# Patient Record
Sex: Male | Born: 1940 | Race: White | Hispanic: No | Smoking: Never smoker
Health system: Southern US, Community
[De-identification: ages and names within clinical notes are randomized; demographics above are authoritative.]

---

## 2001-05-12 ENCOUNTER — Ambulatory Visit (HOSPITAL_COMMUNITY): Admission: RE | Admit: 2001-05-12 | Discharge: 2001-05-12 | Payer: Self-pay | Admitting: Family Medicine

## 2017-11-23 ENCOUNTER — Inpatient Hospital Stay (HOSPITAL_COMMUNITY): Payer: Medicare Other

## 2017-11-23 ENCOUNTER — Emergency Department (HOSPITAL_COMMUNITY): Payer: Medicare Other

## 2017-11-23 ENCOUNTER — Inpatient Hospital Stay (HOSPITAL_COMMUNITY)
Admission: EM | Admit: 2017-11-23 | Discharge: 2017-12-04 | DRG: 064 | Disposition: E | Payer: Medicare Other | Attending: Pulmonary Disease | Admitting: Pulmonary Disease

## 2017-11-23 ENCOUNTER — Encounter (HOSPITAL_COMMUNITY): Payer: Self-pay

## 2017-11-23 ENCOUNTER — Other Ambulatory Visit: Payer: Self-pay

## 2017-11-23 DIAGNOSIS — J9600 Acute respiratory failure, unspecified whether with hypoxia or hypercapnia: Secondary | ICD-10-CM

## 2017-11-23 DIAGNOSIS — R739 Hyperglycemia, unspecified: Secondary | ICD-10-CM | POA: Diagnosis present

## 2017-11-23 DIAGNOSIS — G8101 Flaccid hemiplegia affecting right dominant side: Secondary | ICD-10-CM | POA: Diagnosis present

## 2017-11-23 DIAGNOSIS — I2609 Other pulmonary embolism with acute cor pulmonale: Secondary | ICD-10-CM | POA: Diagnosis present

## 2017-11-23 DIAGNOSIS — I110 Hypertensive heart disease with heart failure: Secondary | ICD-10-CM | POA: Diagnosis present

## 2017-11-23 DIAGNOSIS — T45515A Adverse effect of anticoagulants, initial encounter: Secondary | ICD-10-CM | POA: Diagnosis not present

## 2017-11-23 DIAGNOSIS — I619 Nontraumatic intracerebral hemorrhage, unspecified: Secondary | ICD-10-CM | POA: Diagnosis present

## 2017-11-23 DIAGNOSIS — Z515 Encounter for palliative care: Secondary | ICD-10-CM

## 2017-11-23 DIAGNOSIS — I639 Cerebral infarction, unspecified: Secondary | ICD-10-CM

## 2017-11-23 DIAGNOSIS — I2699 Other pulmonary embolism without acute cor pulmonale: Secondary | ICD-10-CM

## 2017-11-23 DIAGNOSIS — R29724 NIHSS score 24: Secondary | ICD-10-CM | POA: Diagnosis present

## 2017-11-23 DIAGNOSIS — I63312 Cerebral infarction due to thrombosis of left middle cerebral artery: Secondary | ICD-10-CM | POA: Diagnosis present

## 2017-11-23 DIAGNOSIS — W19XXXA Unspecified fall, initial encounter: Secondary | ICD-10-CM

## 2017-11-23 DIAGNOSIS — I361 Nonrheumatic tricuspid (valve) insufficiency: Secondary | ICD-10-CM | POA: Diagnosis not present

## 2017-11-23 DIAGNOSIS — Z4659 Encounter for fitting and adjustment of other gastrointestinal appliance and device: Secondary | ICD-10-CM

## 2017-11-23 DIAGNOSIS — G935 Compression of brain: Secondary | ICD-10-CM | POA: Diagnosis not present

## 2017-11-23 DIAGNOSIS — G9340 Encephalopathy, unspecified: Secondary | ICD-10-CM | POA: Diagnosis present

## 2017-11-23 DIAGNOSIS — R29810 Facial weakness: Secondary | ICD-10-CM | POA: Diagnosis present

## 2017-11-23 DIAGNOSIS — J9601 Acute respiratory failure with hypoxia: Secondary | ICD-10-CM | POA: Diagnosis present

## 2017-11-23 DIAGNOSIS — G936 Cerebral edema: Secondary | ICD-10-CM | POA: Diagnosis not present

## 2017-11-23 DIAGNOSIS — Z66 Do not resuscitate: Secondary | ICD-10-CM | POA: Diagnosis present

## 2017-11-23 DIAGNOSIS — I9589 Other hypotension: Secondary | ICD-10-CM | POA: Diagnosis present

## 2017-11-23 DIAGNOSIS — E785 Hyperlipidemia, unspecified: Secondary | ICD-10-CM | POA: Diagnosis present

## 2017-11-23 DIAGNOSIS — I63412 Cerebral infarction due to embolism of left middle cerebral artery: Secondary | ICD-10-CM

## 2017-11-23 DIAGNOSIS — D696 Thrombocytopenia, unspecified: Secondary | ICD-10-CM | POA: Diagnosis present

## 2017-11-23 DIAGNOSIS — R4701 Aphasia: Secondary | ICD-10-CM | POA: Diagnosis present

## 2017-11-23 DIAGNOSIS — Z87891 Personal history of nicotine dependence: Secondary | ICD-10-CM | POA: Diagnosis not present

## 2017-11-23 DIAGNOSIS — I5032 Chronic diastolic (congestive) heart failure: Secondary | ICD-10-CM | POA: Diagnosis present

## 2017-11-23 DIAGNOSIS — E872 Acidosis: Secondary | ICD-10-CM | POA: Diagnosis present

## 2017-11-23 DIAGNOSIS — Z8673 Personal history of transient ischemic attack (TIA), and cerebral infarction without residual deficits: Secondary | ICD-10-CM | POA: Diagnosis not present

## 2017-11-23 LAB — HEMOGLOBIN A1C
Hgb A1c MFr Bld: 5.5 % (ref 4.8–5.6)
Mean Plasma Glucose: 111.15 mg/dL

## 2017-11-23 LAB — CBC WITH DIFFERENTIAL/PLATELET
Basophils Absolute: 0 10*3/uL (ref 0.0–0.1)
Basophils Relative: 0 %
EOS ABS: 0.1 10*3/uL (ref 0.0–0.7)
EOS PCT: 1 %
HCT: 45.5 % (ref 39.0–52.0)
Hemoglobin: 15.4 g/dL (ref 13.0–17.0)
LYMPHS ABS: 3.5 10*3/uL (ref 0.7–4.0)
Lymphocytes Relative: 40 %
MCH: 33.3 pg (ref 26.0–34.0)
MCHC: 33.8 g/dL (ref 30.0–36.0)
MCV: 98.3 fL (ref 78.0–100.0)
MONO ABS: 0.5 10*3/uL (ref 0.1–1.0)
MONOS PCT: 6 %
Neutro Abs: 4.7 10*3/uL (ref 1.7–7.7)
Neutrophils Relative %: 53 %
PLATELETS: 127 10*3/uL — AB (ref 150–400)
RBC: 4.63 MIL/uL (ref 4.22–5.81)
RDW: 13 % (ref 11.5–15.5)
WBC: 8.8 10*3/uL (ref 4.0–10.5)

## 2017-11-23 LAB — URINALYSIS, ROUTINE W REFLEX MICROSCOPIC
BACTERIA UA: NONE SEEN
BILIRUBIN URINE: NEGATIVE
Glucose, UA: 150 mg/dL — AB
KETONES UR: NEGATIVE mg/dL
LEUKOCYTES UA: NEGATIVE
NITRITE: NEGATIVE
PH: 5 (ref 5.0–8.0)
Protein, ur: NEGATIVE mg/dL
Specific Gravity, Urine: 1.036 — ABNORMAL HIGH (ref 1.005–1.030)
Squamous Epithelial / LPF: NONE SEEN

## 2017-11-23 LAB — PROTIME-INR
INR: 1.21
INR: 1.35
Prothrombin Time: 15.2 seconds (ref 11.4–15.2)
Prothrombin Time: 16.6 seconds — ABNORMAL HIGH (ref 11.4–15.2)

## 2017-11-23 LAB — COMPREHENSIVE METABOLIC PANEL
ALBUMIN: 3.9 g/dL (ref 3.5–5.0)
ALT: 17 U/L (ref 17–63)
AST: 27 U/L (ref 15–41)
Alkaline Phosphatase: 76 U/L (ref 38–126)
Anion gap: 13 (ref 5–15)
BUN: 11 mg/dL (ref 6–20)
CHLORIDE: 102 mmol/L (ref 101–111)
CO2: 19 mmol/L — AB (ref 22–32)
CREATININE: 1.43 mg/dL — AB (ref 0.61–1.24)
Calcium: 8.8 mg/dL — ABNORMAL LOW (ref 8.9–10.3)
GFR calc Af Amer: 54 mL/min — ABNORMAL LOW (ref >60)
GFR calc non Af Amer: 46 mL/min — ABNORMAL LOW (ref >60)
GLUCOSE: 218 mg/dL — AB (ref 65–99)
Potassium: 4.1 mmol/L (ref 3.5–5.1)
Sodium: 134 mmol/L — ABNORMAL LOW (ref 135–145)
Total Bilirubin: 0.8 mg/dL (ref 0.3–1.2)
Total Protein: 7.2 g/dL (ref 6.5–8.1)

## 2017-11-23 LAB — I-STAT ARTERIAL BLOOD GAS, ED
Acid-base deficit: 5 mmol/L — ABNORMAL HIGH (ref 0.0–2.0)
BICARBONATE: 21.8 mmol/L (ref 20.0–28.0)
O2 SAT: 99 %
PCO2 ART: 47.7 mmHg (ref 32.0–48.0)
PO2 ART: 156 mmHg — AB (ref 83.0–108.0)
Patient temperature: 98.6
TCO2: 23 mmol/L (ref 22–32)
pH, Arterial: 7.268 — ABNORMAL LOW (ref 7.350–7.450)

## 2017-11-23 LAB — I-STAT CHEM 8, ED
BUN: 13 mg/dL (ref 6–20)
CALCIUM ION: 1.18 mmol/L (ref 1.15–1.40)
Chloride: 100 mmol/L — ABNORMAL LOW (ref 101–111)
Creatinine, Ser: 1.2 mg/dL (ref 0.61–1.24)
GLUCOSE: 221 mg/dL — AB (ref 65–99)
HCT: 47 % (ref 39.0–52.0)
Hemoglobin: 16 g/dL (ref 13.0–17.0)
Potassium: 4.1 mmol/L (ref 3.5–5.1)
SODIUM: 138 mmol/L (ref 135–145)
TCO2: 22 mmol/L (ref 22–32)

## 2017-11-23 LAB — I-STAT TROPONIN, ED: Troponin i, poc: 0 ng/mL (ref 0.00–0.08)

## 2017-11-23 LAB — ETHANOL

## 2017-11-23 LAB — GLUCOSE, CAPILLARY
GLUCOSE-CAPILLARY: 110 mg/dL — AB (ref 65–99)
GLUCOSE-CAPILLARY: 90 mg/dL (ref 65–99)
Glucose-Capillary: 79 mg/dL (ref 65–99)

## 2017-11-23 LAB — CK TOTAL AND CKMB (NOT AT ARMC)
CK, MB: 9.2 ng/mL — AB (ref 0.5–5.0)
RELATIVE INDEX: 3.6 — AB (ref 0.0–2.5)
Total CK: 255 U/L (ref 49–397)

## 2017-11-23 LAB — RAPID URINE DRUG SCREEN, HOSP PERFORMED
Amphetamines: NOT DETECTED
Barbiturates: NOT DETECTED
Benzodiazepines: NOT DETECTED
Cocaine: NOT DETECTED
OPIATES: NOT DETECTED
Tetrahydrocannabinol: NOT DETECTED

## 2017-11-23 LAB — MAGNESIUM: Magnesium: 1.8 mg/dL (ref 1.7–2.4)

## 2017-11-23 LAB — LACTIC ACID, PLASMA: Lactic Acid, Venous: 2.2 mmol/L (ref 0.5–1.9)

## 2017-11-23 LAB — TROPONIN I
TROPONIN I: 0.41 ng/mL — AB (ref <0.03)
TROPONIN I: 0.29 ng/mL — AB (ref <0.03)

## 2017-11-23 LAB — CBG MONITORING, ED: GLUCOSE-CAPILLARY: 109 mg/dL — AB (ref 65–99)

## 2017-11-23 LAB — TRIGLYCERIDES: TRIGLYCERIDES: 84 mg/dL (ref <150)

## 2017-11-23 LAB — APTT
APTT: 28 s (ref 24–36)
aPTT: 184 seconds (ref 24–36)

## 2017-11-23 LAB — MRSA PCR SCREENING: MRSA BY PCR: NEGATIVE

## 2017-11-23 MED ORDER — INSULIN ASPART 100 UNIT/ML ~~LOC~~ SOLN
0.0000 [IU] | SUBCUTANEOUS | Status: DC
  Administered 2017-11-24 (×2): 1 [IU] via SUBCUTANEOUS

## 2017-11-23 MED ORDER — PROPOFOL 1000 MG/100ML IV EMUL
INTRAVENOUS | Status: AC
  Administered 2017-11-23: 30 ug/kg/min via INTRAVENOUS
  Filled 2017-11-23: qty 100

## 2017-11-23 MED ORDER — ETOMIDATE 2 MG/ML IV SOLN
INTRAVENOUS | Status: AC | PRN
  Administered 2017-11-23: 20 mg via INTRAVENOUS

## 2017-11-23 MED ORDER — ACETAMINOPHEN 650 MG RE SUPP: 650.0000 mg | RECTAL | Status: DC | PRN

## 2017-11-23 MED ORDER — SODIUM CHLORIDE 0.9 % IV SOLN
INTRAVENOUS | Status: DC
  Administered 2017-11-23 – 2017-11-25 (×4): via INTRAVENOUS

## 2017-11-23 MED ORDER — FENTANYL CITRATE (PF) 100 MCG/2ML IJ SOLN: 50.0000 ug | INTRAMUSCULAR | Status: DC | PRN

## 2017-11-23 MED ORDER — LORAZEPAM 2 MG/ML IJ SOLN
INTRAMUSCULAR | Status: AC
  Administered 2017-11-23: 1 mg
  Filled 2017-11-23: qty 1

## 2017-11-23 MED ORDER — HEPARIN BOLUS VIA INFUSION
5000.0000 [IU] | Freq: Once | INTRAVENOUS | Status: AC
  Administered 2017-11-23: 5000 [IU] via INTRAVENOUS
  Filled 2017-11-23: qty 5000

## 2017-11-23 MED ORDER — ACETAMINOPHEN 160 MG/5ML PO SOLN
650.0000 mg | ORAL | Status: DC | PRN
  Administered 2017-11-23 – 2017-11-25 (×3): 650 mg
  Filled 2017-11-23 (×4): qty 20.3

## 2017-11-23 MED ORDER — PROPOFOL 1000 MG/100ML IV EMUL
0.0000 ug/kg/min | INTRAVENOUS | Status: DC
  Administered 2017-11-23 (×2): 30 ug/kg/min via INTRAVENOUS
  Administered 2017-11-23: 10 ug/kg/min via INTRAVENOUS
  Administered 2017-11-24 – 2017-11-25 (×3): 20 ug/kg/min via INTRAVENOUS
  Filled 2017-11-23 (×5): qty 100

## 2017-11-23 MED ORDER — STROKE: EARLY STAGES OF RECOVERY BOOK
Freq: Once | Status: AC
  Administered 2017-11-23: 18:00:00
  Filled 2017-11-23: qty 1

## 2017-11-23 MED ORDER — HEPARIN (PORCINE) IN NACL 100-0.45 UNIT/ML-% IJ SOLN
1200.0000 [IU]/h | INTRAMUSCULAR | Status: DC
  Administered 2017-11-23 – 2017-11-24 (×2): 1350 [IU]/h via INTRAVENOUS
  Filled 2017-11-23 (×4): qty 250

## 2017-11-23 MED ORDER — LORAZEPAM 2 MG/ML IJ SOLN
1.0000 mg | Freq: Once | INTRAMUSCULAR | Status: DC
  Administered 2017-11-23: 1 mg via INTRAVENOUS

## 2017-11-23 MED ORDER — PANTOPRAZOLE SODIUM 40 MG IV SOLR
40.0000 mg | Freq: Every day | INTRAVENOUS | Status: DC
  Administered 2017-11-23 – 2017-11-24 (×2): 40 mg via INTRAVENOUS
  Filled 2017-11-23 (×2): qty 40

## 2017-11-23 MED ORDER — FENTANYL CITRATE (PF) 100 MCG/2ML IJ SOLN
50.0000 ug | INTRAMUSCULAR | Status: AC | PRN
  Administered 2017-11-25 (×3): 50 ug via INTRAVENOUS
  Filled 2017-11-23 (×3): qty 2

## 2017-11-23 MED ORDER — ORAL CARE MOUTH RINSE
15.0000 mL | Freq: Four times a day (QID) | OROMUCOSAL | Status: DC
  Administered 2017-11-23: 15 mL via OROMUCOSAL

## 2017-11-23 MED ORDER — IOPAMIDOL (ISOVUE-370) INJECTION 76%
INTRAVENOUS | Status: AC
  Administered 2017-11-23: 40 mL
  Filled 2017-11-23: qty 50

## 2017-11-23 MED ORDER — CHLORHEXIDINE GLUCONATE 0.12% ORAL RINSE (MEDLINE KIT)
15.0000 mL | Freq: Two times a day (BID) | OROMUCOSAL | Status: DC
  Administered 2017-11-23 – 2017-11-25 (×4): 15 mL via OROMUCOSAL

## 2017-11-23 MED ORDER — ACETAMINOPHEN 325 MG PO TABS: 650.0000 mg | ORAL_TABLET | ORAL | Status: DC | PRN

## 2017-11-23 MED ORDER — ROCURONIUM BROMIDE 50 MG/5ML IV SOLN
INTRAVENOUS | Status: AC | PRN
  Administered 2017-11-23: 80 mg via INTRAVENOUS

## 2017-11-23 MED ORDER — ORAL CARE MOUTH RINSE
15.0000 mL | OROMUCOSAL | Status: DC
  Administered 2017-11-23 – 2017-11-25 (×15): 15 mL via OROMUCOSAL

## 2017-11-23 NOTE — ED Notes (Signed)
 1mg  ativan given, 5 total mg now.

## 2017-11-23 NOTE — ED Notes (Addendum)
 1mg  ativan given while pt in CT. 4 total mg of ativan given now.

## 2017-11-23 NOTE — ED Notes (Signed)
[facility] called @ 1140/Activate Code Stroke-per Dr. Rubin PayorPickering

## 2017-11-23 NOTE — Progress Notes (Signed)
Dr Molli KnockYacoub notified of PTT level of 184, pharmacist Aggie Cosierheresa notified and she stated it was due to blood draw being too close to heparin bolus and they would reassess at midnight draw. Dr Molli KnockYacoub aware of pharmacist update and troponin level of 0.4. No new orders given will continue to monitor.

## 2017-11-23 NOTE — ED Notes (Signed)
All pt's belongings sent home with pt's sister and brother in law. This includes the pt's pant,s undewear, boots, socks and license.

## 2017-11-23 NOTE — ED Notes (Addendum)
Pt's sister now here, Tilden FossaSandra Winstead [phone]. She states that the [place] jog and has had no medical issues except for an eye surgery. Pt does not go to doctors and does not like to and has not been to the dr in a long time. She states that she has not talked to him since Thanksgiving.

## 2017-11-23 NOTE — Progress Notes (Signed)
ANTICOAGULATION CONSULT NOTE - Initial Consult  Pharmacy Consult for Heparin Indication: pulmonary embolus  No Known Allergies  Patient Measurements: Height: 6' (182.9 cm) Weight: 176 lb 5.9 oz (80 kg) IBW/kg (Calculated) : 77.6 Heparin Dosing Weight: 80 kg   Vital Signs: Temp: 98.5 F (36.9 C) (12/21 1305) Temp Source: Rectal (12/21 1305) BP: 118/90 (12/21 1430) Pulse Rate: 97 (12/21 1430)  Labs: Recent Labs    12/02/2017 1135 11/03/2017 1148 11/15/2017 1153  HGB 15.4  --  16.0  HCT 45.5  --  47.0  PLT 127*  --   --   APTT  --  28  --   LABPROT  --  15.2  --   INR  --  1.21  --   CREATININE 1.43*  --  1.20    Estimated Creatinine Clearance: 58.4 mL/min (by C-G formula based on SCr of 1.2 mg/dL).   Medical History: History reviewed. No pertinent past medical history.  Medications:   (Not in a hospital admission)  Assessment: [age] YOM found down brought to the ED and intubated. Pharmacy consulted to start IV heparin for new PE. H/H wnl. Plt 127k. SCr 1.2.   Goal of Therapy:  Heparin level 0.3-0.7 units/ml Monitor platelets by anticoagulation protocol: Yes   Plan:  -Heparin 5000 units IV bolus, then heparin infusion at 1350 units/hr -F/u 8 hr HL -Monitor daily HL, CBC and s/s of bleeding  Zachary Greene, PharmD., BCPS Clinical Pharmacist Pager [phone]

## 2017-11-23 NOTE — Progress Notes (Signed)
RN alerted neurology MD on call as to patient's fever and interventions given.  Ordered to observe.

## 2017-11-23 NOTE — ED Notes (Signed)
Pt given 1mg  of ativan

## 2017-11-23 NOTE — ED Notes (Signed)
Pt taken back to CT by this RN and 2 stroke RN's for CTA.

## 2017-11-23 NOTE — Code Documentation (Signed)
76[facility]  [facility]  Unknown LKW.  Patient brought to the ED where he was noted to have left gaze and right hemiplegia on exam.  Code stroke activated.  Stroke team to the bedside.  Patient agitated, Ativan administered per MD.  Patient to CT with team.  CT completed followed by CTP.  Additional Ativan administered per MD. Patient transferred back to Trauma B for intubation.  Patient intubated by EDP.  Patient transferred back to CT for CTA.  Patient is outside the window for treatment with tPA.  Patient is not an endovascular intervention candidate per Dr. Otelia LimesLindzen.  Patient transferred back to Trauma B.  Patient's sister to the bedside.  She reports patient walks/jogs every morning.  She has not seen or talked to patient since Thanksgiving.  No acute stroke treatment at this time.  Patient to be admitted to ICU.  Bedside handoff with ED RN Caryn BeeKevin.

## 2017-11-23 NOTE — ED Notes (Signed)
 1mg  ativan given, 3mg  total given now

## 2017-11-23 NOTE — ED Notes (Addendum)
Per EMS, pt found down at [place] unresponsive and blue. Unknown downtime. Pt placed on NRB. Pt noted to have right side paralysis and right leg parylsis with left sided gaze. Hypertensive. Not following any commands and pulling with his left arm and leg. Cold to the touch. VS 154/105, CBG 230, HR 115, spo2 unreadable due to pt being cold

## 2017-11-23 NOTE — H&P (Signed)
PULMONARY / CRITICAL CARE MEDICINE   Name: Zachary Greene MRN: [id number] DOB: 02/27/1941    ADMISSION DATE:  December 18, 2017 CONSULTATION DATE:  12/21  REFERRING MD:  pickering  CHIEF COMPLAINT:  Acute stroke w/ acute pulmonary emboli  HISTORY OF PRESENT ILLNESS:   This is a 76 year old male who lives independently. Last seen by family around thanksgiving. Apparently works, runs in park every day. Was found down in park unresponsive on walking trail w/ right sided weakness. Down time unknown. Was brought to ER. Found to be hypoxic and agitated so was intubated and placed on vent. CT head showing massive acute left MCA stroke, and incidentally bottom cuts of CT imaging demonstrated acute pulmonary emboli.  The core size of the stroke was felt to large for neuro interventional procedure.  Critical care was asked to admit.  PAST MEDICAL HISTORY :  He  has no past medical history on file.  PAST SURGICAL HISTORY: He  has no past surgical history on file.  No Known Allergies  No current facility-administered medications on file prior to encounter.    No current outpatient medications on file prior to encounter.    FAMILY HISTORY:  His has no family status information on file.    SOCIAL HISTORY: He  reports that  has never smoked. He does not have any smokeless tobacco history on file. He reports that he does not drink alcohol or use drugs.Stopped smoking several years ago lives alone runs daily apparently still works  REVIEW OF SYSTEMS:   Unable  SUBJECTIVE:  Sedated on vent  VITAL SIGNS: BP 117/90   Pulse 88   Temp 98.5 F (36.9 C) (Rectal)   Resp (!) 21   Ht 6' (1.829 m)   Wt 176 lb 5.9 oz (80 kg)   SpO2 93%   BMI 23.92 kg/m   HEMODYNAMICS:    VENTILATOR SETTINGS: FiO2 (%):  [60 %] 60 %  INTAKE / OUTPUT: No intake/output data recorded.  PHYSICAL EXAMINATION: General: Well-developed white male currently sedated on ventilator occasionally agitated Neuro:  Right-sided hemiparesis cough and gag present does not follow commands  HEENT:  normocephalic atraumatic orally intubated no jugular venous distention mucous membranes moist Cardiovascular: Regular rate and rhythm without murmur rub or gallop Lungs: Scattered rhonchi Abdomen: Soft nontender Musculoskeletal: Right-sided weakness Skin: Warm and dry  LABS:  BMET Recent Labs  Lab 12-18-17 1135 2017/12/18 1153  NA 134* 138  K 4.1 4.1  CL 102 100*  CO2 19*  --   BUN 11 13  CREATININE 1.43* 1.20  GLUCOSE 218* 221*    Electrolytes Recent Labs  Lab 12-18-2017 1135  CALCIUM 8.8*    CBC Recent Labs  Lab 12/18/2017 1135 Dec 18, 2017 1153  WBC 8.8  --   HGB 15.4 16.0  HCT 45.5 47.0  PLT 127*  --     Coag's Recent Labs  Lab 12-18-2017 1148  APTT 28  INR 1.21    Sepsis Markers No results for input(s): LATICACIDVEN, PROCALCITON, O2SATVEN in the last 168 hours.  ABG Recent Labs  Lab 2017-12-18 1310  PHART 7.268*  PCO2ART 47.7  PO2ART 156.0*    Liver Enzymes Recent Labs  Lab Dec 18, 2017 1135  AST 27  ALT 17  ALKPHOS 76  BILITOT 0.8  ALBUMIN 3.9    Cardiac Enzymes No results for input(s): TROPONINI, PROBNP in the last 168 hours.  Glucose Recent Labs  Lab 2017/12/18 1244  GLUCAP 109*    Imaging Ct Angio Head W  Or Wo Contrast  Result Date: 11/06/2017 CLINICAL DATA:  76 year old male. Code stroke. CT perfusion demonstrating left MCA territory core infarct and penumbra. EXAM: CT ANGIOGRAPHY HEAD AND NECK TECHNIQUE: Multidetector CT imaging of the head and neck was performed using the standard protocol during bolus administration of intravenous contrast. Multiplanar CT image reconstructions and MIPs were obtained to evaluate the vascular anatomy. Carotid stenosis measurements (when applicable) are obtained utilizing NASCET criteria, using the distal internal carotid diameter as the denominator. CONTRAST:  50 mL Isovue 370 COMPARISON:  CT perfusion 1211 hours today.  Noncontrasthead CT 1158 hours today. FINDINGS: CTA NECK Skeleton: Degenerative changes in the cervical spine. No acute osseous abnormality identified. Visualized paranasal sinuses and mastoids are stable and well pneumatized. Upper chest: Bilateral main pulmonary artery emboli, with bulky thrombus at both hila. See series 5, image 198 on the left and image 206 on the right. Pulmonary thrombus extends into anterior upper lobe pulmonary artery branches bilaterally. The remaining chest is not included. Mild upper lobe pulmonary septal thickening. No confluent pulmonary opacity or pleural effusion. No superior mediastinal lymphadenopathy. Enteric tube courses through the visible esophagus. Other neck: Oral enteric tube in place. Enteric tube courses into the cervical esophagus. Endotracheal tube tip terminates in the trachea at the level of the clavicles. Small volume retained secretions in the trachea are row on the tube. Thyroid, parapharyngeal spaces, retropharyngeal space, sublingual space, submandibular spaces and parotid spaces are within normal limits. Fluid in the pharynx and posterior nasal cavity related to intubation. No cervical lymphadenopathy. No acute orbit or scalp soft tissue findings. Aortic arch: Abnormal bilateral main pulmonary arteries, as stated above. Three vessel arch configuration. No arch atherosclerosis or stenosis. Right carotid system: No brachiocephalic or right CCA origin plaque or stenosis. At the right carotid bifurcation there is mild soft and calcified plaque but no significant ICA origins stenosis. However, bulky soft plaque been tracks throughout the right ICA bulb with high-grade stenosis at the level of the distal bulb numerically estimated at 70 % with respect to the distal vessel. See series 8, image 72. The right ICA remains patent, and is tortuous distal to the bulb but without additional plaque. Left carotid system: Normal left CCA origin. No left CCA plaque identified.  Widely patent left carotid bifurcation. Somewhat unusual bulb type appearance of the proximal left ECA which is directed laterally (also series 8, image 73). Soft and calcified plaque in the medial right ICA origin and bulb, but no associated stenosis. Cervical left ICA then is within normal limits to the skullbase. Vertebral arteries: No proximal right subclavian artery stenosis despite calcified plaque. Normal right vertebral artery origin. Mildly non dominant right vertebral artery is patent to the skullbase without stenosis. Mild soft plaque in the proximal left subclavian artery without stenosis. Calcified plaque at the left vertebral artery origin and proximal V1 segment resulting in mild stenosis. Dominant left vertebral artery is otherwise patent and normal to the skullbase. CTA HEAD Posterior circulation: Dominant distal left vertebral artery. No distal vertebral stenosis. Both PICA origins are patent. Patent vertebrobasilar junction. Somewhat diminutive basilar artery, but no basilar stenosis. Patent SCA origins. Fetal type bilateral PCA origins. Posterior communicating arteries appear normal. Bilateral PCA branches are within normal limits. Anterior circulation: Both ICA siphons are patent. There is mild bilateral calcified siphon plaque. No bilateral siphon stenosis. Normal ophthalmic and posterior communicating artery origins. Patent carotid termini. Normal MCA and ACA origins, the left A1 is dominant and the right is highly diminutive. Anterior  communicating artery and bilateral ACA branches are normal. Right MCA M1 segment, bifurcation, and right MCA branches are within normal limits. The left MCA origin and proximal M1 segment is patent but there is partial occlusion of the distal left MCA M1 and left MCA bifurcation. See series 10, image 19. There is little left MCA branch collateral enhancement in the early phase, mostly in the middle left MCA division. Venous sinuses: Early contrast bolus timing,  the major dural venous sinuses appear grossly patent. Anatomic variants: Dominant left vertebral artery. Dominant left ACA A1 segment. Fetal type PCA origins. Review of the MIP images confirms the above findings IMPRESSION: 1. Positive for bilateral Acute Pulmonary Embolus. Partially visible but fairly bulky thrombus in the distal main pulmonary arteries at both hila. Critical Value/emergent results were called by telephone at the time of interpretation on 16-Dec-2017 at 1335 hours to Dr. Carmell Austria in the ED who verbally acknowledged these results. 2. Positive for Emergent Large Vessel Occlusion, distal Left MCA M1 segment. However, non-favorable appearance of collateral enhancement in the left MCA branches, and an estimated 78 mL core infarct on CT perfusion (at 1211 hours today). 3. 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 4. No other significant atherosclerotic stenosis in the head or neck. 5. ET tube and NG tube appear well placed. Electronically Signed   By: Odessa Fleming M.D.   On: 12-16-17 13:51   Ct Angio Neck W Or Wo Contrast  Result Date: December 16, 2017 CLINICAL DATA:  76 year old male. Code stroke. CT perfusion demonstrating left MCA territory core infarct and penumbra. EXAM: CT ANGIOGRAPHY HEAD AND NECK TECHNIQUE: Multidetector CT imaging of the head and neck was performed using the standard protocol during bolus administration of intravenous contrast. Multiplanar CT image reconstructions and MIPs were obtained to evaluate the vascular anatomy. Carotid stenosis measurements (when applicable) are obtained utilizing NASCET criteria, using the distal internal carotid diameter as the denominator. CONTRAST:  50 mL Isovue 370 COMPARISON:  CT perfusion 1211 hours today. Noncontrasthead CT 1158 hours today. FINDINGS: CTA NECK Skeleton: Degenerative changes in the cervical spine. No acute osseous abnormality identified. Visualized  paranasal sinuses and mastoids are stable and well pneumatized. Upper chest: Bilateral main pulmonary artery emboli, with bulky thrombus at both hila. See series 5, image 198 on the left and image 206 on the right. Pulmonary thrombus extends into anterior upper lobe pulmonary artery branches bilaterally. The remaining chest is not included. Mild upper lobe pulmonary septal thickening. No confluent pulmonary opacity or pleural effusion. No superior mediastinal lymphadenopathy. Enteric tube courses through the visible esophagus. Other neck: Oral enteric tube in place. Enteric tube courses into the cervical esophagus. Endotracheal tube tip terminates in the trachea at the level of the clavicles. Small volume retained secretions in the trachea are row on the tube. Thyroid, parapharyngeal spaces, retropharyngeal space, sublingual space, submandibular spaces and parotid spaces are within normal limits. Fluid in the pharynx and posterior nasal cavity related to intubation. No cervical lymphadenopathy. No acute orbit or scalp soft tissue findings. Aortic arch: Abnormal bilateral main pulmonary arteries, as stated above. Three vessel arch configuration. No arch atherosclerosis or stenosis. Right carotid system: No brachiocephalic or right CCA origin plaque or stenosis. At the right carotid bifurcation there is mild soft and calcified plaque but no significant ICA origins stenosis. However, bulky soft plaque been tracks throughout the right ICA bulb with high-grade stenosis at the level of the distal bulb  numerically estimated at 70 % with respect to the distal vessel. See series 8, image 72. The right ICA remains patent, and is tortuous distal to the bulb but without additional plaque. Left carotid system: Normal left CCA origin. No left CCA plaque identified. Widely patent left carotid bifurcation. Somewhat unusual bulb type appearance of the proximal left ECA which is directed laterally (also series 8, image 73). Soft and  calcified plaque in the medial right ICA origin and bulb, but no associated stenosis. Cervical left ICA then is within normal limits to the skullbase. Vertebral arteries: No proximal right subclavian artery stenosis despite calcified plaque. Normal right vertebral artery origin. Mildly non dominant right vertebral artery is patent to the skullbase without stenosis. Mild soft plaque in the proximal left subclavian artery without stenosis. Calcified plaque at the left vertebral artery origin and proximal V1 segment resulting in mild stenosis. Dominant left vertebral artery is otherwise patent and normal to the skullbase. CTA HEAD Posterior circulation: Dominant distal left vertebral artery. No distal vertebral stenosis. Both PICA origins are patent. Patent vertebrobasilar junction. Somewhat diminutive basilar artery, but no basilar stenosis. Patent SCA origins. Fetal type bilateral PCA origins. Posterior communicating arteries appear normal. Bilateral PCA branches are within normal limits. Anterior circulation: Both ICA siphons are patent. There is mild bilateral calcified siphon plaque. No bilateral siphon stenosis. Normal ophthalmic and posterior communicating artery origins. Patent carotid termini. Normal MCA and ACA origins, the left A1 is dominant and the right is highly diminutive. Anterior communicating artery and bilateral ACA branches are normal. Right MCA M1 segment, bifurcation, and right MCA branches are within normal limits. The left MCA origin and proximal M1 segment is patent but there is partial occlusion of the distal left MCA M1 and left MCA bifurcation. See series 10, image 19. There is little left MCA branch collateral enhancement in the early phase, mostly in the middle left MCA division. Venous sinuses: Early contrast bolus timing, the major dural venous sinuses appear grossly patent. Anatomic variants: Dominant left vertebral artery. Dominant left ACA A1 segment. Fetal type PCA origins. Review  of the MIP images confirms the above findings IMPRESSION: 1. Positive for bilateral Acute Pulmonary Embolus. Partially visible but fairly bulky thrombus in the distal main pulmonary arteries at both hila. Critical Value/emergent results were called by telephone at the time of interpretation on 11/22/2017 at 1335 hours to Dr. Carmell Austria in the ED who verbally acknowledged these results. 2. Positive for Emergent Large Vessel Occlusion, distal Left MCA M1 segment. However, non-favorable appearance of collateral enhancement in the left MCA branches, and an estimated 78 mL core infarct on CT perfusion (at 1211 hours today). 3. 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 4. No other significant atherosclerotic stenosis in the head or neck. 5. ET tube and NG tube appear well placed. Electronically Signed   By: Odessa Fleming M.D.   On: 11/05/2017 13:51   Ct C-spine No Charge  Result Date: 11/29/2017 CLINICAL DATA:  76 year old male code stroke and fall. EXAM: CT CERVICAL SPINE WITHOUT CONTRAST TECHNIQUE: CT images of the cervical spine were reformatted off of the Neck CTA performed today. COMPARISON:  CTA head and neck today reported separately. FINDINGS: Alignment: Relatively preserved cervical lordosis. Mild degenerative appearing retrolisthesis of C4 on C5. Similar trace anterolisthesis of C7 on T1. Cervicothoracic junction alignment is within normal limits. Bilateral posterior element alignment is within normal limits. Skull base and vertebrae:  Visualized skull base is intact. No atlanto-occipital dissociation. No cervical spine fracture. Soft tissues and spinal canal: Negative CT appearance of the cervical spinal canal aside from degenerative stenosis. Other neck CT and CTA findings today are reported separately. Disc levels: Moderate and severe cervical disc space loss with endplate spurring U0-A5C3-C4 through C6-C7. Associated multilevel  degenerative spinal stenosis at those disc levels. Stenosis is up to moderate at C4-C5. There is associated bilateral degenerative neural foraminal stenosis at those same levels, severe bilaterally at C4-C5 (bilateral C5 nerve level). Upper chest: Negative lung apices. See other chest findings today on the comparison CTA. IMPRESSION: 1.  No acute fracture in the cervical spine. 2. 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 Up to moderate spinal stenosis at C4-C5. Electronically Signed   By: Odessa FlemingH  Hall M.D.   On: 11/19/2017 13:56   Ct Cerebral Perfusion W Contrast  Addendum Date: 11/16/2017   ADDENDUM REPORT: 11/18/2017 13:08 ADDENDUM: Study discussed by telephone with Dr. Caryl PinaERIC LINDZEN on 11/14/2017 at 1306 hours. Electronically Signed   By: Odessa FlemingH  Hall M.D.   On: 11/30/2017 13:08   Result Date: 11/16/2017 CLINICAL DATA:  76 year old male code stroke. Right side weakness and leftward gaze. EXAM: CT PERFUSION BRAIN TECHNIQUE: Multiphase CT imaging of the brain was performed following IV bolus contrast injection. Subsequent parametric perfusion maps were calculated using RAPID software. CONTRAST:  40mL ISOVUE-370 IOPAMIDOL (ISOVUE-370) INJECTION 76% COMPARISON:  Noncontrast head CT 1158 hours today. FINDINGS: CT Brain Perfusion Findings: CBF (<30%) Volume: 78 mL Perfusion (Tmax>6.0s) volume: 115 mL Mismatch Volume: 37 mL Infarction Location:Left MCA territory. CTA head and neck is pending, the patient became 2 agitated following this exam to continue. Evaluation of the source CTP images suggest a lesion at the left MCA bifurcation, with delayed left sylvian fissure left MCA branch enhancement, and relative nonenhancement of posterior sylvian division branches. IMPRESSION: 1. CT Brain Perfusion detects a left MCA core infarct with estimated volume 78 mL. 2. Estimated brain penumbra volume is 115 mL. 3. CTA head and neck is pending, but CTP source images suggest a left MCA bifurcation and/or posterior M2  branch ELVO. Dr. Agnes LawrenceE. Lindzen was paged to discuss at 1232 hours. Electronically Signed: By: Odessa FlemingH  Hall M.D. On: 11/28/2017 12:37   Dg Chest Portable 1 View  Result Date: 11/03/2017 CLINICAL DATA:  Code stroke.  Ventilator support. EXAM: PORTABLE CHEST 1 VIEW COMPARISON:  Earlier same day FINDINGS: Endotracheal tube tip is 4 cm above the carina. Nasogastric tube enters the stomach. The lungs are clear. The vascularity is normal. No effusions. No significant bone finding. Density related to the first rib ends. IMPRESSION: Endotracheal tube and nasogastric tube well position. No active disease. Electronically Signed   By: Paulina FusiMark  Shogry M.D.   On: 11/06/2017 12:49   Dg Chest Portable 1 View  Result Date: 11/27/2017 CLINICAL DATA:  Code stroke.  Unresponsive.  Right-sided weakness. EXAM: PORTABLE CHEST 1 VIEW COMPARISON:  None. FINDINGS: Cardiac silhouette upper normal in size to mildly enlarged for AP portable technique. Thoracic aorta tortuous and mildly atherosclerotic. Prominent central pulmonary arteries. Opacity projecting over the medial right upper lobe may reflect calcified costal cartilage from the anterior right first rib overlapping with bronchovascular markings and the posterior fourth rib. Lungs otherwise clear. Left costophrenic angle excluded from the image. No visible pleural effusions. IMPRESSION: 1. Opacity projecting over the right upper lobe which may be artifactual. A follow-up chest x-ray when the patient is clinically able is recommended. 2. Otherwise, no acute  cardiopulmonary disease. 3. Borderline to mild cardiomegaly without pulmonary edema. Electronically Signed   By: Hulan Saas M.D.   On: 2017/12/04 11:59   Ct Head Code Stroke Wo Contrast`  Result Date: 12-04-17 CLINICAL DATA:  Code stroke. RIGHT-sided weakness. LEFT gaze preference. EXAM: CT HEAD WITHOUT CONTRAST TECHNIQUE: Contiguous axial images were obtained from the base of the skull through the vertex without  intravenous contrast. COMPARISON:  None. FINDINGS: Brain: Significant motion degradation. Study is of marginal diagnostic utility. Suspected hypodensity involving LEFT internal capsule, LEFT insula, and LEFT M6 parietal cortex, all LEFT MCA territory. Also hypodensity involving the LEFT occipital cortex, possible acute LEFT PCA territory infarct. Chronic infarct involving LEFT cerebellum. Generalized atrophy. Hypoattenuation of white matter, likely small vessel disease. No hemorrhage, mass lesion, or extra-axial fluid. Vascular: Atherosclerotic calcification of the carotid siphons. Suspected hyperdense LEFT ICA terminus and LEFT M1/M2 MCA. Skull: Normal. Negative for fracture or focal lesion. Sinuses/Orbits: No acute finding. Other: None. ASPECTS [facility] Stroke Program Early CT Score) - Ganglionic level infarction (caudate, lentiform nuclei, internal capsule, insula, M1-M3 cortex): 5 - Supraganglionic infarction (M4-M6 cortex): 2 Total score (0-10 with 10 being normal): 7 IMPRESSION: 1. Motion degraded scan is of marginal diagnostic utility. Suspected emergent large vessel occlusion involving LEFT ICA and LEFT MCA. 2. ASPECTS is 7. Additional suspected acute stroke LEFT occipital lobe, Chronic LEFT cerebellar infarct, neither of which contribute to ASPECTS. These results were called by telephone at the time of interpretation on 12-04-17 at 12:17 pm to Dr. Otelia Limes, who verbally acknowledged these results. Electronically Signed   By: Elsie Stain M.D.   On: 2017-12-04 12:22     STUDIES:  Ct angio head/neck 12/21: 1. Positive for bilateral Acute Pulmonary Embolus. Partially visible but fairly bulky thrombus in the distal main pulmonary arteries at both hila. 2. Positive for Emergent Large Vessel Occlusion, distal Left MCA M1 segment. However, non-favorable appearance of collateral enhancement in the left MCA branches, and an estimated 78 mL core infarct on CT perfusion (at 1211 hours today).b 3. Positive  also for bulky soft plaque in the Right ICA bulb with high-grade (estimated 70%) cervical right ICA stenosis. 4. No other significant atherosclerotic stenosis in the head or Neck. 5. ET tube and NG tube appear well placed. ECHO 12/21>>> Carotid dopplers 12/21>>> LE [place] 12/21>>>  CULTURES:   ANTIBIOTICS:   SIGNIFICANT EVENTS:   LINES/TUBES: oett 12/21>>>  DISCUSSION: Admit w/ acute PE and large left MCA stroke. Vent/heparin/supportive care. Not candidate for IR neurovascular procedure. prog poor.  I discussed this with his sisters who are at bedside they have agreed to DO NOT RESUSCITATE status.  We will continue to support him and hope for the best however they understand prognosis poor.  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 PFO?  F/u carotid dopplers freq neuro checks  Metabolic acidosis (NAG) -at risk for AKI Plan Repeat chemistry NS infusion  Check total CKs (given down time)  Hyperglycemia Plan Serial cbg ssi if indicated   FAMILY  - Updates:   - Inter-disciplinary family meet or Palliative Care  meeting due by:  12/28  My ccm time 35 min 11/09/2017, 2:53 PM  Attending Note:  76 year old male with PMH above who [place] and was noted to have a massive PE and a massive left sided stroke.  Patient was agitated so was intubated.  Neuro evaluated patient and was felt that the stroke was too large for interventional procedure.  PCCM was asked to admit.  Patient is paralyzed on the right on exam  with clear lungs.  I reviewed CXR myself, ETT ok and head CT with a massive left sided CVA.  Discussed with neurology and PCCM-NP.  Spoke with sister, full DNR, no aggressive interventions, will treat supportively for the next 48 and anticoagulation then revisit in 48 hours.  The patient is critically ill with multiple organ systems failure and requires high complexity decision making for assessment and support, frequent evaluation and titration of therapies, application of advanced monitoring technologies and extensive interpretation of multiple databases.   Critical Care Time devoted to patient care services described in this note is  90  Minutes. This time reflects time of care of this signee Dr Koren BoundWesam Yacoub. This critical care time does not reflect procedure time, or teaching time or supervisory time of PA/NP/Med student/Med Resident etc but could involve care discussion time.  Alyson ReedyWesam G. Yacoub, M.D. [facility]. Pager: [phone]. After hours pager: [phone].

## 2017-11-23 NOTE — ED Notes (Addendum)
Pt given 1mg  of ativan, 2mg  total now

## 2017-11-23 NOTE — Consult Note (Signed)
Referring Physician: Dr. Rubin PayorPickering    Chief Complaint: Found down unresponsive with right sided flaccidity  HPI: Zachary Greene is an 76 y.o. male who was [place] down [place] this morning unresponsive with right sided flaccidity. On arrival to the ED he was semiconscious and agitated with tachypnea, not responding to any commands. EMS on scene and initiated transport about 30 minutes prior to arrival (about 11 AM). LKN is not known. There is no prior history listed in Epic for review; also with no prior notes in Epic.    LSN: Not known tPA Given: No: LKN unknown.   No past medical history on file.  No family history on file. Social History:  has no tobacco, alcohol, and drug history on file.  Allergies: Allergies not on file  Medications: Unknown  ROS:  Unable to obtain due to AMS.  Physical Examination: Blood pressure (!) 159/105, pulse 92, resp. rate (!) 22.  HEENT: Small abrasion to central forehead. No obvious other signs of trauma.  Lungs: Tachypneic Ext: No edema  Neurologic Examination: Mental Status: Agitated, nonverbal, not attending to external stimuli. Does not follow any comvands or attempt to communicate. Thrashes LUE and occasionally moves LLE nonpurposefully and does not localize to pain.  Cranial Nerves: II: No blink to threat. Pupils equal.  III,IV, VI: Ptosis not present. Initially with leftward forced eye deviation which resolves after Ativan administration. V,VII: Question of right facial droop.  VIII: No response to voice IX,X: Unable to assess XI: Head rotated to left.  XII: Unable to assess Motor: RUE: Flaccid with no spontaneous movement. No movement to noxious.  VHQ:IONGEXBMWRLE:Initially not moving, then briefly flexed spontaneously, semipurposefully.  LUE: Thrashes with >/= 4/5 strength LLE: Thrashes with >/= 4/5 strength Sensory: No reaction to right sided stimuli.  Deep Tendon Reflexes:  3+ bilateral brachioradialis and biceps.  3+ right  patella, 4+ left patella, 3+ achilles bilaterally.  Right toe upgoing, left toe downgoing.  Cerebellar/Gait: Unable to assess.  CTA head. CTA neck, CTP:  1. Positive for bilateral Acute Pulmonary Embolus. Partially visible but fairly bulky thrombus in the distal main pulmonary arteries at both hila. 2. Positive for Emergent Large Vessel Occlusion, distal Left MCA M1 segment. However, non-favorable appearance of collateral enhancement in the left MCA branches, and an estimated 78 mL core infarct on CT perfusion (at 1211 hours today). 3. 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 4. No other significant atherosclerotic stenosis in the head or neck.  Assessment: 76 y.o. male presenting with acute onset of unreponsiveness and right hemiplegia after being found face down on a walking trail.  1. CT head shows subtle hypodensities in left MCA distribution concerning for possible large left MCA stroke 2. CTA shows left M1 occlusion 3. CTP shows infarct core of 78 cc with small penumbra. Not a tPA candidate due to unknown LKN. Not an endovascular candidate due to large size of infarct core.  4. CTA also shows bilateral pulmonary emboli. In the setting of the stroke, a PFO is suspected.   Plan: 1. Agree with anticoagulation for PE. Benefits of anticoagulation for prevention of PE extension outweigh risks of hemorrhagic conversion of the left MCA stroke.  2. Agree with obtaining TTE with bubble study to assess for possible PFO.  3. MRI brain when stable.  4. HgbA1c, fasting lipid panel 5. PT consult, OT consult, Speech consult when patient able to participate 6. Repeat  CT head in 12 hours to determine if there is development of mass effect from the infarction 7. Telemetry monitoring 8. Frequent neuro checks  45 minutes spent in the emergent Neurological evaluation and management of this acute stroke  patient  @Electronically  signed: Dr. Caryl PinaEric Kayton Ripp@  30-Dec-2016, 11:53 AM

## 2017-11-23 NOTE — ED Notes (Signed)
Dr. Otelia LimesLindzen paged to [phone]-Per Dr. Rubin PayorPickering

## 2017-11-23 NOTE — ED Notes (Signed)
Critical Care MD in room with pt's sister and brother in law.

## 2017-11-23 NOTE — ED Provider Notes (Signed)
[facility] EMERGENCY DEPARTMENT Provider Note   CSN: [id number] Arrival date & time: 12/02/2017  1128     History   Chief Complaint Chief Complaint  Patient presents with  . Altered Mental Status    HPI Zachary Greene is a 76 y.o. male.  HPI Level 5 caveat due to altered mental status.  Patient was brought in by EMS after being found face down unresponsive on the [place].  Initially blue and mottled.  Moving left side spontaneously and eyes deviated to the left but flaccid on the right.  Unknown history.  Slight abrasion to forehead.  Unknown medical history.  Unknown last normal. History reviewed. No pertinent past medical history.  Patient Active Problem List   Diagnosis Date Noted  . Stroke [facility]) 11/22/2017    History reviewed. No pertinent surgical history.     Home Medications    Prior to Admission medications   Not on File    Family History History reviewed. No pertinent family history.  Social History Social History   Tobacco Use  . Smoking status: Never Smoker  Substance Use Topics  . Alcohol use: No    Frequency: Never  . Drug use: No     Allergies   Patient has no known allergies.   Review of Systems Review of Systems  Unable to perform ROS: Mental status change     Physical Exam Updated Vital Signs BP 125/85   Pulse 91   Temp 98.5 F (36.9 C) (Rectal)   Resp (!) 23   Ht 6' (1.829 m)   Wt 80 kg (176 lb 5.9 oz)   SpO2 94%   BMI 23.92 kg/m   Physical Exam  Constitutional: He appears well-nourished.  HENT:  Slight abrasion to mid forehead.  Eyes:  Eyes deviated to the left  Neck:  Cervical collar in place  Pulmonary/Chest: Effort normal.  Abdominal: He exhibits no distension.  Musculoskeletal: He exhibits no deformity.  Neurological:  Patient attempting to take off his mask with his left hand.  We will not follow commands.  Breathing spontaneously.  Moving left arm and left leg.  Eyes deviated to  left.  No response to pain on right side.  Gag reflex intact may be somewhat decreased.  Skin: Skin is warm.  Skin is cool and somewhat cyanotic     ED Treatments / Results  Labs (all labs ordered are listed, but only abnormal results are displayed) Labs Reviewed  COMPREHENSIVE METABOLIC PANEL - Abnormal; Notable for the following components:      Result Value   Sodium 134 (*)    CO2 19 (*)    Glucose, Bld 218 (*)    Creatinine, Ser 1.43 (*)    Calcium 8.8 (*)    GFR calc non Af Amer 46 (*)    GFR calc Af Amer 54 (*)    All other components within normal limits  CBC WITH DIFFERENTIAL/PLATELET - Abnormal; Notable for the following components:   Platelets 127 (*)    All other components within normal limits  URINALYSIS, ROUTINE W REFLEX MICROSCOPIC - Abnormal; Notable for the following components:   Specific Gravity, Urine 1.036 (*)    Glucose, UA 150 (*)    Hgb urine dipstick SMALL (*)    All other components within normal limits  I-STAT CHEM 8, ED - Abnormal; Notable for the following components:   Chloride 100 (*)    Glucose, Bld 221 (*)    All other components within  normal limits  CBG MONITORING, ED - Abnormal; Notable for the following components:   Glucose-Capillary 109 (*)    All other components within normal limits  I-STAT ARTERIAL BLOOD GAS, ED - Abnormal; Notable for the following components:   pH, Arterial 7.268 (*)    pO2, Arterial 156.0 (*)    Acid-base deficit 5.0 (*)    All other components within normal limits  RAPID URINE DRUG SCREEN, HOSP PERFORMED  ETHANOL  PROTIME-INR  APTT  TRIGLYCERIDES  MAGNESIUM  TROPONIN I  TROPONIN I  TROPONIN I  APTT  PROTIME-INR  HEMOGLOBIN A1C  CK TOTAL AND CKMB (NOT AT [facility])  LACTIC ACID, PLASMA  HEPARIN LEVEL (UNFRACTIONATED)  I-STAT TROPONIN, ED    EKG  EKG Interpretation  Date/Time:  Friday November 23 2017 11:43:15 EST Ventricular Rate:  93 PR Interval:    QRS Duration: 92 QT Interval:  368 QTC  Calculation: 458 R Axis:   -65 Text Interpretation:  Sinus rhythm Left anterior fascicular block Borderline repolarization abnormality Borderline ST elevation, anterior leads Confirmed by Davonna Belling [phone]) on 12/01/2017 3:10:15 PM       Radiology Ct Angio Head W Or Wo Contrast  Result Date: 12/01/2017 CLINICAL DATA:  76 year old male. Code stroke. CT perfusion demonstrating left MCA territory core infarct and penumbra. EXAM: CT ANGIOGRAPHY HEAD AND NECK TECHNIQUE: Multidetector CT imaging of the head and neck was performed using the standard protocol during bolus administration of intravenous contrast. Multiplanar CT image reconstructions and MIPs were obtained to evaluate the vascular anatomy. Carotid stenosis measurements (when applicable) are obtained utilizing NASCET criteria, using the distal internal carotid diameter as the denominator. CONTRAST:  50 mL Isovue 370 COMPARISON:  CT perfusion 1211 hours today. Noncontrasthead CT 1158 hours today. FINDINGS: CTA NECK Skeleton: Degenerative changes in the cervical spine. No acute osseous abnormality identified. Visualized paranasal sinuses and mastoids are stable and well pneumatized. Upper chest: Bilateral main pulmonary artery emboli, with bulky thrombus at both hila. See series 5, image 198 on the left and image 206 on the right. Pulmonary thrombus extends into anterior upper lobe pulmonary artery branches bilaterally. The remaining chest is not included. Mild upper lobe pulmonary septal thickening. No confluent pulmonary opacity or pleural effusion. No superior mediastinal lymphadenopathy. Enteric tube courses through the visible esophagus. Other neck: Oral enteric tube in place. Enteric tube courses into the cervical esophagus. Endotracheal tube tip terminates in the trachea at the level of the clavicles. Small volume retained secretions in the trachea are row on the tube. Thyroid, parapharyngeal spaces, retropharyngeal space, sublingual  space, submandibular spaces and parotid spaces are within normal limits. Fluid in the pharynx and posterior nasal cavity related to intubation. No cervical lymphadenopathy. No acute orbit or scalp soft tissue findings. Aortic arch: Abnormal bilateral main pulmonary arteries, as stated above. Three vessel arch configuration. No arch atherosclerosis or stenosis. Right carotid system: No brachiocephalic or right CCA origin plaque or stenosis. At the right carotid bifurcation there is mild soft and calcified plaque but no significant ICA origins stenosis. However, bulky soft plaque been tracks throughout the right ICA bulb with high-grade stenosis at the level of the distal bulb numerically estimated at 70 % with respect to the distal vessel. See series 8, image 72. The right ICA remains patent, and is tortuous distal to the bulb but without additional plaque. Left carotid system: Normal left CCA origin. No left CCA plaque identified. Widely patent left carotid bifurcation. Somewhat unusual bulb type appearance of the proximal left  ECA which is directed laterally (also series 8, image 73). Soft and calcified plaque in the medial right ICA origin and bulb, but no associated stenosis. Cervical left ICA then is within normal limits to the skullbase. Vertebral arteries: No proximal right subclavian artery stenosis despite calcified plaque. Normal right vertebral artery origin. Mildly non dominant right vertebral artery is patent to the skullbase without stenosis. Mild soft plaque in the proximal left subclavian artery without stenosis. Calcified plaque at the left vertebral artery origin and proximal V1 segment resulting in mild stenosis. Dominant left vertebral artery is otherwise patent and normal to the skullbase. CTA HEAD Posterior circulation: Dominant distal left vertebral artery. No distal vertebral stenosis. Both PICA origins are patent. Patent vertebrobasilar junction. Somewhat diminutive basilar artery, but no  basilar stenosis. Patent SCA origins. Fetal type bilateral PCA origins. Posterior communicating arteries appear normal. Bilateral PCA branches are within normal limits. Anterior circulation: Both ICA siphons are patent. There is mild bilateral calcified siphon plaque. No bilateral siphon stenosis. Normal ophthalmic and posterior communicating artery origins. Patent carotid termini. Normal MCA and ACA origins, the left A1 is dominant and the right is highly diminutive. Anterior communicating artery and bilateral ACA branches are normal. Right MCA M1 segment, bifurcation, and right MCA branches are within normal limits. The left MCA origin and proximal M1 segment is patent but there is partial occlusion of the distal left MCA M1 and left MCA bifurcation. See series 10, image 19. There is little left MCA branch collateral enhancement in the early phase, mostly in the middle left MCA division. Venous sinuses: Early contrast bolus timing, the major dural venous sinuses appear grossly patent. Anatomic variants: Dominant left vertebral artery. Dominant left ACA A1 segment. Fetal type PCA origins. Review of the MIP images confirms the above findings IMPRESSION: 1. Positive for bilateral Acute Pulmonary Embolus. Partially visible but fairly bulky thrombus in the distal main pulmonary arteries at both hila. Critical Value/emergent results were called by telephone at the time of interpretation on 11/04/2017 at 1335 hours to Dr. Delphina Cahill in the ED who verbally acknowledged these results. 2. Positive for Emergent Large Vessel Occlusion, distal Left MCA M1 segment. However, non-favorable appearance of collateral enhancement in the left MCA branches, and an estimated 78 mL core infarct on CT perfusion (at 1211 hours today). 3. 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 4. No other significant atherosclerotic stenosis in the head or neck. 5. ET tube  and NG tube appear well placed. Electronically Signed   By: Genevie Ann M.D.   On: 11/06/2017 13:51   Ct Angio Neck W Or Wo Contrast  Result Date: 11/06/2017 CLINICAL DATA:  76 year old male. Code stroke. CT perfusion demonstrating left MCA territory core infarct and penumbra. EXAM: CT ANGIOGRAPHY HEAD AND NECK TECHNIQUE: Multidetector CT imaging of the head and neck was performed using the standard protocol during bolus administration of intravenous contrast. Multiplanar CT image reconstructions and MIPs were obtained to evaluate the vascular anatomy. Carotid stenosis measurements (when applicable) are obtained utilizing NASCET criteria, using the distal internal carotid diameter as the denominator. CONTRAST:  50 mL Isovue 370 COMPARISON:  CT perfusion 1211 hours today. Noncontrasthead CT 1158 hours today. FINDINGS: CTA NECK Skeleton: Degenerative changes in the cervical spine. No acute osseous abnormality identified. Visualized paranasal sinuses and mastoids are stable and well pneumatized. Upper chest: Bilateral main pulmonary artery emboli, with bulky thrombus at both hila. See series 5,  image 198 on the left and image 206 on the right. Pulmonary thrombus extends into anterior upper lobe pulmonary artery branches bilaterally. The remaining chest is not included. Mild upper lobe pulmonary septal thickening. No confluent pulmonary opacity or pleural effusion. No superior mediastinal lymphadenopathy. Enteric tube courses through the visible esophagus. Other neck: Oral enteric tube in place. Enteric tube courses into the cervical esophagus. Endotracheal tube tip terminates in the trachea at the level of the clavicles. Small volume retained secretions in the trachea are row on the tube. Thyroid, parapharyngeal spaces, retropharyngeal space, sublingual space, submandibular spaces and parotid spaces are within normal limits. Fluid in the pharynx and posterior nasal cavity related to intubation. No cervical  lymphadenopathy. No acute orbit or scalp soft tissue findings. Aortic arch: Abnormal bilateral main pulmonary arteries, as stated above. Three vessel arch configuration. No arch atherosclerosis or stenosis. Right carotid system: No brachiocephalic or right CCA origin plaque or stenosis. At the right carotid bifurcation there is mild soft and calcified plaque but no significant ICA origins stenosis. However, bulky soft plaque been tracks throughout the right ICA bulb with high-grade stenosis at the level of the distal bulb numerically estimated at 70 % with respect to the distal vessel. See series 8, image 72. The right ICA remains patent, and is tortuous distal to the bulb but without additional plaque. Left carotid system: Normal left CCA origin. No left CCA plaque identified. Widely patent left carotid bifurcation. Somewhat unusual bulb type appearance of the proximal left ECA which is directed laterally (also series 8, image 73). Soft and calcified plaque in the medial right ICA origin and bulb, but no associated stenosis. Cervical left ICA then is within normal limits to the skullbase. Vertebral arteries: No proximal right subclavian artery stenosis despite calcified plaque. Normal right vertebral artery origin. Mildly non dominant right vertebral artery is patent to the skullbase without stenosis. Mild soft plaque in the proximal left subclavian artery without stenosis. Calcified plaque at the left vertebral artery origin and proximal V1 segment resulting in mild stenosis. Dominant left vertebral artery is otherwise patent and normal to the skullbase. CTA HEAD Posterior circulation: Dominant distal left vertebral artery. No distal vertebral stenosis. Both PICA origins are patent. Patent vertebrobasilar junction. Somewhat diminutive basilar artery, but no basilar stenosis. Patent SCA origins. Fetal type bilateral PCA origins. Posterior communicating arteries appear normal. Bilateral PCA branches are within  normal limits. Anterior circulation: Both ICA siphons are patent. There is mild bilateral calcified siphon plaque. No bilateral siphon stenosis. Normal ophthalmic and posterior communicating artery origins. Patent carotid termini. Normal MCA and ACA origins, the left A1 is dominant and the right is highly diminutive. Anterior communicating artery and bilateral ACA branches are normal. Right MCA M1 segment, bifurcation, and right MCA branches are within normal limits. The left MCA origin and proximal M1 segment is patent but there is partial occlusion of the distal left MCA M1 and left MCA bifurcation. See series 10, image 19. There is little left MCA branch collateral enhancement in the early phase, mostly in the middle left MCA division. Venous sinuses: Early contrast bolus timing, the major dural venous sinuses appear grossly patent. Anatomic variants: Dominant left vertebral artery. Dominant left ACA A1 segment. Fetal type PCA origins. Review of the MIP images confirms the above findings IMPRESSION: 1. Positive for bilateral Acute Pulmonary Embolus. Partially visible but fairly bulky thrombus in the distal main pulmonary arteries at both hila. Critical Value/emergent results were called by telephone at the time of  interpretation on 11/06/2017 at 1335 hours to Dr. Delphina Cahill in the ED who verbally acknowledged these results. 2. Positive for Emergent Large Vessel Occlusion, distal Left MCA M1 segment. However, non-favorable appearance of collateral enhancement in the left MCA branches, and an estimated 78 mL core infarct on CT perfusion (at 1211 hours today). 3. 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 4. No other significant atherosclerotic stenosis in the head or neck. 5. ET tube and NG tube appear well placed. Electronically Signed   By: Genevie Ann M.D.   On: 11/22/2017 13:51   Ct C-spine No Charge  Result Date:  11/22/2017 CLINICAL DATA:  76 year old male code stroke and fall. EXAM: CT CERVICAL SPINE WITHOUT CONTRAST TECHNIQUE: CT images of the cervical spine were reformatted off of the Neck CTA performed today. COMPARISON:  CTA head and neck today reported separately. FINDINGS: Alignment: Relatively preserved cervical lordosis. Mild degenerative appearing retrolisthesis of C4 on C5. Similar trace anterolisthesis of C7 on T1. Cervicothoracic junction alignment is within normal limits. Bilateral posterior element alignment is within normal limits. Skull base and vertebrae: Visualized skull base is intact. No atlanto-occipital dissociation. No cervical spine fracture. Soft tissues and spinal canal: Negative CT appearance of the cervical spinal canal aside from degenerative stenosis. Other neck CT and CTA findings today are reported separately. Disc levels: Moderate and severe cervical disc space loss with endplate spurring X9-B7 through C6-C7. Associated multilevel degenerative spinal stenosis at those disc levels. Stenosis is up to moderate at C4-C5. There is associated bilateral degenerative neural foraminal stenosis at those same levels, severe bilaterally at C4-C5 (bilateral C5 nerve level). Upper chest: Negative lung apices. See other chest findings today on the comparison CTA. IMPRESSION: 1.  No acute fracture in the cervical spine. 2. 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 Up to moderate spinal stenosis at C4-C5. Electronically Signed   By: Genevie Ann M.D.   On: 11/06/2017 13:56   Ct Cerebral Perfusion W Contrast  Addendum Date: 12/01/2017   ADDENDUM REPORT: 11/09/2017 13:08 ADDENDUM: Study discussed by telephone with Dr. Kerney Elbe on 11/05/2017 at 1306 hours. Electronically Signed   By: Genevie Ann M.D.   On: 11/29/2017 13:08   Result Date: 11/19/2017 CLINICAL DATA:  76 year old male code stroke. Right side weakness and leftward gaze. EXAM: CT PERFUSION BRAIN TECHNIQUE: Multiphase CT imaging  of the brain was performed following IV bolus contrast injection. Subsequent parametric perfusion maps were calculated using RAPID software. CONTRAST:  29m ISOVUE-370 IOPAMIDOL (ISOVUE-370) INJECTION 76% COMPARISON:  Noncontrast head CT 1158 hours today. FINDINGS: CT Brain Perfusion Findings: CBF (<30%) Volume: 78 mL Perfusion (Tmax>6.0s) volume: 115 mL Mismatch Volume: 37 mL Infarction Location:Left MCA territory. CTA head and neck is pending, the patient became 2 agitated following this exam to continue. Evaluation of the source CTP images suggest a lesion at the left MCA bifurcation, with delayed left sylvian fissure left MCA branch enhancement, and relative nonenhancement of posterior sylvian division branches. IMPRESSION: 1. CT Brain Perfusion detects a left MCA core infarct with estimated volume 78 mL. 2. Estimated brain penumbra volume is 115 mL. 3. CTA head and neck is pending, but CTP source images suggest a left MCA bifurcation and/or posterior M2 branch ELVO. Dr. EBruce Donathwas paged to discuss at 1232 hours. Electronically Signed: By: HGenevie AnnM.D. On: 11/07/2017 12:37   Dg Chest Portable 1 View  Result Date: 11/07/2017 CLINICAL DATA:  Code  stroke.  Ventilator support. EXAM: PORTABLE CHEST 1 VIEW COMPARISON:  Earlier same day FINDINGS: Endotracheal tube tip is 4 cm above the carina. Nasogastric tube enters the stomach. The lungs are clear. The vascularity is normal. No effusions. No significant bone finding. Density related to the first rib ends. IMPRESSION: Endotracheal tube and nasogastric tube well position. No active disease. Electronically Signed   By: Nelson Chimes M.D.   On: 11/05/2017 12:49   Dg Chest Portable 1 View  Result Date: 11/07/2017 CLINICAL DATA:  Code stroke.  Unresponsive.  Right-sided weakness. EXAM: PORTABLE CHEST 1 VIEW COMPARISON:  None. FINDINGS: Cardiac silhouette upper normal in size to mildly enlarged for AP portable technique. Thoracic aorta tortuous and mildly  atherosclerotic. Prominent central pulmonary arteries. Opacity projecting over the medial right upper lobe may reflect calcified costal cartilage from the anterior right first rib overlapping with bronchovascular markings and the posterior fourth rib. Lungs otherwise clear. Left costophrenic angle excluded from the image. No visible pleural effusions. IMPRESSION: 1. Opacity projecting over the right upper lobe which may be artifactual. A follow-up chest x-ray when the patient is clinically able is recommended. 2. Otherwise, no acute cardiopulmonary disease. 3. Borderline to mild cardiomegaly without pulmonary edema. Electronically Signed   By: Evangeline Dakin M.D.   On: 11/14/2017 11:59   Ct Head Code Stroke Wo Contrast`  Result Date: 11/17/2017 CLINICAL DATA:  Code stroke. RIGHT-sided weakness. LEFT gaze preference. EXAM: CT HEAD WITHOUT CONTRAST TECHNIQUE: Contiguous axial images were obtained from the base of the skull through the vertex without intravenous contrast. COMPARISON:  None. FINDINGS: Brain: Significant motion degradation. Study is of marginal diagnostic utility. Suspected hypodensity involving LEFT internal capsule, LEFT insula, and LEFT M6 parietal cortex, all LEFT MCA territory. Also hypodensity involving the LEFT occipital cortex, possible acute LEFT PCA territory infarct. Chronic infarct involving LEFT cerebellum. Generalized atrophy. Hypoattenuation of white matter, likely small vessel disease. No hemorrhage, mass lesion, or extra-axial fluid. Vascular: Atherosclerotic calcification of the carotid siphons. Suspected hyperdense LEFT ICA terminus and LEFT M1/M2 MCA. Skull: Normal. Negative for fracture or focal lesion. Sinuses/Orbits: No acute finding. Other: None. ASPECTS [employer] Stroke Program Early CT Score) - Ganglionic level infarction (caudate, lentiform nuclei, internal capsule, insula, M1-M3 cortex): 5 - Supraganglionic infarction (M4-M6 cortex): 2 Total score (0-10 with 10 being  normal): 7 IMPRESSION: 1. Motion degraded scan is of marginal diagnostic utility. Suspected emergent large vessel occlusion involving LEFT ICA and LEFT MCA. 2. ASPECTS is 7. Additional suspected acute stroke LEFT occipital lobe, Chronic LEFT cerebellar infarct, neither of which contribute to ASPECTS. These results were called by telephone at the time of interpretation on 11/18/2017 at 12:17 pm to Dr. Cheral Marker, who verbally acknowledged these results. Electronically Signed   By: Staci Righter M.D.   On: 11/22/2017 12:22    Procedures Procedure Name: Intubation Date/Time: 11/22/2017 3:46 PM Performed by: Davonna Belling, MD Pre-anesthesia Checklist: Patient identified Oxygen Delivery Method: Non-rebreather mask Preoxygenation: Pre-oxygenation with 100% oxygen Induction Type: Rapid sequence Ventilation: Mask ventilation without difficulty Laryngoscope Size: Glidescope and 4 Tube type: Subglottic suction tube Tube size: 7.5 mm Number of attempts: 1 Airway Equipment and Method: Video-laryngoscopy Secured at: 22 cm Tube secured with: ETT holder Dental Injury: Teeth and Oropharynx as per pre-operative assessment  Comments: Overall intubation with good visualization and was not that difficult.      (including critical care time)  Medications Ordered in ED Medications  fentaNYL (SUBLIMAZE) injection 50 mcg (not administered)  fentaNYL (SUBLIMAZE) injection 50  mcg (not administered)  propofol (DIPRIVAN) 1000 MG/100ML infusion (40 mcg/kg/min  80 kg Intravenous Rate/Dose Change 12/01/2017 1437)  propofol (DIPRIVAN) 1000 MG/100ML infusion (not administered)   stroke: mapping our early stages of recovery book (not administered)  acetaminophen (TYLENOL) tablet 650 mg (not administered)    Or  acetaminophen (TYLENOL) solution 650 mg (not administered)    Or  acetaminophen (TYLENOL) suppository 650 mg (not administered)  pantoprazole (PROTONIX) injection 40 mg (not administered)  0.9 %  sodium  chloride infusion ( Intravenous New Bag/Given 11/16/2017 1452)  insulin aspart (novoLOG) injection 0-9 Units (not administered)  chlorhexidine gluconate (MEDLINE KIT) (PERIDEX) 0.12 % solution 15 mL (not administered)  MEDLINE mouth rinse (not administered)  heparin ADULT infusion 100 units/mL (25000 units/221m sodium chloride 0.45%) (1,350 Units/hr Intravenous New Bag/Given 11/24/2017 1532)  LORazepam (ATIVAN) 2 MG/ML injection (1 mg  Given 11/15/2017 1141)  LORazepam (ATIVAN) 2 MG/ML injection (1 mg  Given 11/24/2017 1145)  iopamidol (ISOVUE-370) 76 % injection (40 mLs  Contrast Given 11/06/2017 1215)  LORazepam (ATIVAN) 2 MG/ML injection (1 mg  Given 11/29/2017 1152)  etomidate (AMIDATE) injection (20 mg Intravenous Given 11/22/2017 1227)  rocuronium (ZEMURON) injection (80 mg Intravenous Given 11/30/2017 1228)  heparin bolus via infusion 5,000 Units (5,000 Units Intravenous Bolus from Bag 11/16/2017 1532)     Initial Impression / Assessment and Plan / ED Course  I have reviewed the triage vital signs and the nursing notes.  Pertinent labs & imaging results that were available during my care of the patient were reviewed by me and considered in my medical decision making (see chart for details).     Patient with mental status changes.  Flaccid on the right and moving left side and eyes deviated to left.  Made a code stroke after I saw him.  However there is an unknown last normal.  Potential trauma due to the fall.  Initial pulse ox are decreased but unsure if this was accurate reading or due to skin being cool.  Seen by neurology and taken to CAT scan.  Required repeat Ativan for sedation and attempt to get a good scan.  However patient kept moving.  With the decreased mental status patient required intubation and was intubated by myself.  Overall good visualization for the intubation but airway is somewhat anterior and had a little bit of a difficult bend to get the tube down in the trachea.  CT angiography  done and showed bilateral pulmonary embolisms.  Not an interventional candidate for the stroke due to the size on the CT perfusion.  Admit to ICU.    Final Clinical Impressions(s) / ED Diagnoses   Final diagnoses:  Cerebrovascular accident (CVA) due to thrombosis of left middle cerebral artery ([facility]  Other acute pulmonary embolism with acute cor pulmonale ([facility]    ED Discharge Orders    None       PDavonna Belling MD 11/20/2017 [phone]

## 2017-11-24 ENCOUNTER — Inpatient Hospital Stay (HOSPITAL_COMMUNITY): Payer: Medicare Other

## 2017-11-24 ENCOUNTER — Encounter (HOSPITAL_COMMUNITY): Payer: Self-pay

## 2017-11-24 DIAGNOSIS — I361 Nonrheumatic tricuspid (valve) insufficiency: Secondary | ICD-10-CM

## 2017-11-24 DIAGNOSIS — I63132 Cerebral infarction due to embolism of left carotid artery: Secondary | ICD-10-CM

## 2017-11-24 LAB — BLOOD GAS, ARTERIAL
ACID-BASE DEFICIT: 2.5 mmol/L — AB (ref 0.0–2.0)
Bicarbonate: 21 mmol/L (ref 20.0–28.0)
Drawn by: 51155
FIO2: 40
O2 SAT: 93.3 %
PEEP/CPAP: 5 cmH2O
PH ART: 7.441 (ref 7.350–7.450)
Patient temperature: 98.6
RATE: 16 resp/min
VT: 630 mL
pCO2 arterial: 31.3 mmHg — ABNORMAL LOW (ref 32.0–48.0)
pO2, Arterial: 67.6 mmHg — ABNORMAL LOW (ref 83.0–108.0)

## 2017-11-24 LAB — LIPID PANEL
CHOL/HDL RATIO: 3.8 ratio
Cholesterol: 159 mg/dL (ref 0–200)
HDL: 42 mg/dL (ref >40)
LDL CALC: 104 mg/dL — AB (ref 0–99)
Triglycerides: 66 mg/dL (ref <150)
VLDL: 13 mg/dL (ref 0–40)

## 2017-11-24 LAB — CBC
HCT: 39.7 % (ref 39.0–52.0)
Hemoglobin: 13.7 g/dL (ref 13.0–17.0)
MCH: 32.9 pg (ref 26.0–34.0)
MCHC: 34.5 g/dL (ref 30.0–36.0)
MCV: 95.2 fL (ref 78.0–100.0)
PLATELETS: 120 10*3/uL — AB (ref 150–400)
RBC: 4.17 MIL/uL — AB (ref 4.22–5.81)
RDW: 12.9 % (ref 11.5–15.5)
WBC: 9.3 10*3/uL (ref 4.0–10.5)

## 2017-11-24 LAB — TROPONIN I: TROPONIN I: 0.33 ng/mL — AB (ref <0.03)

## 2017-11-24 LAB — GLUCOSE, CAPILLARY
GLUCOSE-CAPILLARY: 100 mg/dL — AB (ref 65–99)
GLUCOSE-CAPILLARY: 105 mg/dL — AB (ref 65–99)
Glucose-Capillary: 128 mg/dL — ABNORMAL HIGH (ref 65–99)
Glucose-Capillary: 102 mg/dL — ABNORMAL HIGH (ref 65–99)
Glucose-Capillary: 135 mg/dL — ABNORMAL HIGH (ref 65–99)
Glucose-Capillary: 100 mg/dL — ABNORMAL HIGH (ref 65–99)

## 2017-11-24 LAB — BASIC METABOLIC PANEL
ANION GAP: 7 (ref 5–15)
BUN: 9 mg/dL (ref 6–20)
CO2: 21 mmol/L — ABNORMAL LOW (ref 22–32)
Calcium: 8.1 mg/dL — ABNORMAL LOW (ref 8.9–10.3)
Chloride: 109 mmol/L (ref 101–111)
Creatinine, Ser: 1.19 mg/dL (ref 0.61–1.24)
GFR calc Af Amer: >60 mL/min (ref >60)
GFR, EST NON AFRICAN AMERICAN: 58 mL/min — AB (ref >60)
Glucose, Bld: 109 mg/dL — ABNORMAL HIGH (ref 65–99)
POTASSIUM: 3.8 mmol/L (ref 3.5–5.1)
SODIUM: 137 mmol/L (ref 135–145)

## 2017-11-24 LAB — APTT
aPTT: 105 seconds — ABNORMAL HIGH (ref 24–36)
aPTT: 38 seconds — ABNORMAL HIGH (ref 24–36)

## 2017-11-24 LAB — HEPARIN LEVEL (UNFRACTIONATED)
HEPARIN UNFRACTIONATED: 0.53 [IU]/mL (ref 0.30–0.70)
HEPARIN UNFRACTIONATED: 0.59 [IU]/mL (ref 0.30–0.70)

## 2017-11-24 LAB — MAGNESIUM: Magnesium: 1.8 mg/dL (ref 1.7–2.4)

## 2017-11-24 MED ORDER — AMIODARONE HCL IN DEXTROSE 360-4.14 MG/200ML-% IV SOLN
30.0000 mg/h | INTRAVENOUS | Status: DC
  Administered 2017-11-24: 30 mg/h via INTRAVENOUS

## 2017-11-24 MED ORDER — AMIODARONE LOAD VIA INFUSION
150.0000 mg | Freq: Once | INTRAVENOUS | Status: AC
  Administered 2017-11-24: 150 mg via INTRAVENOUS
  Filled 2017-11-24: qty 83.34

## 2017-11-24 MED ORDER — METOPROLOL TARTRATE 5 MG/5ML IV SOLN
INTRAVENOUS | Status: AC
  Filled 2017-11-24: qty 5

## 2017-11-24 MED ORDER — AMIODARONE HCL IN DEXTROSE 360-4.14 MG/200ML-% IV SOLN
60.0000 mg/h | INTRAVENOUS | Status: AC
  Administered 2017-11-24 (×2): 60 mg/h via INTRAVENOUS
  Filled 2017-11-24 (×2): qty 200

## 2017-11-24 MED ORDER — METOPROLOL TARTRATE 5 MG/5ML IV SOLN
2.5000 mg | INTRAVENOUS | Status: DC | PRN
  Administered 2017-11-24: 5 mg via INTRAVENOUS
  Administered 2017-11-24: 3 mg via INTRAVENOUS
  Filled 2017-11-24: qty 5

## 2017-11-24 MED ORDER — AMIODARONE HCL IN DEXTROSE 360-4.14 MG/200ML-% IV SOLN
INTRAVENOUS | Status: AC
  Administered 2017-11-24: 150 mg via INTRAVENOUS
  Filled 2017-11-24: qty 200

## 2017-11-24 MED ORDER — STROKE: EARLY STAGES OF RECOVERY BOOK
Freq: Once | Status: AC
  Administered 2017-11-24: 1
  Filled 2017-11-24: qty 1

## 2017-11-24 NOTE — Progress Notes (Signed)
ANTICOAGULATION CONSULT NOTE - Follow Up Consult  Pharmacy Consult for Heparin  Indication: pulmonary embolus  No Known Allergies  Patient Measurements: Height: 6' (182.9 cm) Weight: 176 lb 5.9 oz (80 kg) IBW/kg (Calculated) : 77.6  Vital Signs: Temp: 100 F (37.8 C) (12/22 0100) Temp Source: Core (12/22 0000) BP: 109/74 (12/22 0100) Pulse Rate: 69 (12/22 0100)  Labs: Recent Labs    11/24/2017 1135 11/22/2017 1148 11/22/2017 1153 11/07/2017 1648 12/02/2017 2029 11/07/2017 2348  HGB 15.4  --  16.0  --   --   --   HCT 45.5  --  47.0  --   --   --   PLT 127*  --   --   --   --   --   APTT  --  28  --  184*  --   --   LABPROT  --  15.2  --  16.6*  --   --   INR  --  1.21  --  1.35  --   --   HEPARINUNFRC  --   --   --   --   --  0.59  CREATININE 1.43*  --  1.20  --   --   --   CKTOTAL  --   --   --  255  --   --   CKMB  --   --   --  9.2*  --   --   TROPONINI  --   --   --  0.41* 0.29*  --     Estimated Creatinine Clearance: 58.4 mL/min (by C-G formula based on SCr of 1.2 mg/dL).  Assessment: Heparin for new PE, pt intubated, initial heparin level therapeutic   Goal of Therapy:  Heparin level 0.3-0.7 units/ml Monitor platelets by anticoagulation protocol: Yes   Plan:  Cont heparin 1350 units/hr 1200 Heparin Level   Zachary Greene 11/24/2017,1:11 AM

## 2017-11-24 NOTE — Progress Notes (Signed)
Initial Nutrition Assessment  DOCUMENTATION CODES:  Not applicable  INTERVENTION:  If unable to extubate within 24 hours and IF in line with goals of care, recommend TF.   Initiate TF via OGT/NGT with Vital AF 1.2 at goal rate of 65 ml/h (1560 ml per day)to provide 1872 (+253 from diprivan) kcals, 117 gm protein, 1265 ml free water daily.  NUTRITION DIAGNOSIS:  Inadequate oral intake related to inability to eat as evidenced by NPO status.  GOAL:  Patient will meet greater than or equal to 90% of their needs  MONITOR:  Diet advancement, Vent status, Labs, Weight trends, TF tolerance  REASON FOR ASSESSMENT:  Ventilator    ASSESSMENT:  76 y/o male w/o any PMHx, working, and running in park daily. Found unresponsive on walking trail w/ R side weakness. Intubated upon arrival to ED and CT showed massive acute L MCA stroke as well as acute PE. CVA too large for neuro intervention. Plan for supportive treatment x48 hrs. No aggressive interventions.   Pt intubated, sedated. Had not been seen by family since Thanksgiving. Unknown if any changes in eating/weight immediately PTA.   Obtained new dry weight of 76.7 kg  Physical Exam is WDL. No discernible muscle/fat wasting.   Patient is currently intubated on ventilator support MV: 13.9 L/min Temp (24hrs), Avg:100.5 F (38.1 C), Min:99.3 F (37.4 C), Max:102 F (38.9 C) Propofol: 9.6 ml/hr=253 kcals/day  Labs: Bgs 105-130 Meds: Insulin, PPI, IVF, Propofol  Recent Labs  Lab 2017/05/12 1135 2017/05/12 1153 2017/05/12 1648 11/24/17 0246  NA 134* 138  --  137  K 4.1 4.1  --  3.8  CL 102 100*  --  109  CO2 19*  --   --  21*  BUN 11 13  --  9  CREATININE 1.43* 1.20  --  1.19  CALCIUM 8.8*  --   --  8.1*  MG  --   --  1.8 1.8  GLUCOSE 218* 221*  --  109*    NUTRITION - FOCUSED PHYSICAL EXAM: WDL  Diet Order:  Diet NPO time specified  EDUCATION NEEDS:  No education needs have been identified at this time  Skin:  Skin  Assessment: Reviewed RN Assessment  Last BM:  Unknown  Height:  Ht Readings from Last 1 Encounters:  2017/05/12 6' (1.829 m)   Weight:  Wt Readings from Last 1 Encounters:  11/24/17 169 lb 1.5 oz (76.7 kg)   Ideal Body Weight:  80.91 kg  BMI:  Body mass index is 22.93 kg/m.  Estimated Nutritional Needs:  Kcal:  2200 kcals (PSU 2003 b) Protein:  115-130g Pro (1.5-1.7 g/kg bw) Fluid:  Per MD  Christophe LouisNathan Shizuye Rupert RD, LDN, CNSC Clinical Nutrition Pager: [phone] 11/24/2017 4:33 PM

## 2017-11-24 NOTE — Progress Notes (Signed)
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 Last seen by family around thanksgiving. Apparently works, runs in park every day. Was found down in park unresponsive on walking trail w/ right sided weakness. Down time unknown. Was brought to ER. Found to be hypoxic and agitated so was intubated and placed on vent. CT head showing massive acute left MCA stroke, and incidentally bottom cuts of CT imaging demonstrated acute pulmonary emboli.  The core size of the stroke was felt to large for neuro interventional procedure.  PAST MEDICAL HISTORY :  He  has no past medical history on file.  PAST SURGICAL HISTORY: He  has no past surgical history on file.  No Known Allergies  No current facility-administered medications on file prior to encounter.    No current outpatient medications on file prior to encounter.    FAMILY HISTORY:  His has no family status information on file.    SOCIAL HISTORY: He  reports that  has never smoked. He does not have any smokeless tobacco history on file. He reports that he does not drink alcohol or use drugs.  REVIEW OF SYSTEMS:   sedated  SUBJECTIVE:  No change in course since admission. Remains unresponsive  VITAL SIGNS: BP 114/73 (BP Location: Right Arm)   Pulse 67   Temp 99.5 F (37.5 C) (Core)   Resp (!) 21   Ht 6' (1.829 m)   Wt 79.6 kg (175 lb 7.8 oz)   SpO2 97%   BMI 23.80 kg/m   HEMODYNAMICS:    VENTILATOR SETTINGS: Vent Mode: PRVC FiO2 (%):  [40 %-100 %] 40 % Set Rate:  [16 bmp-20 bmp] 16 bmp Vt Set:  [550 mL-630 mL] 630 mL PEEP:  [5 cmH20] 5 cmH20 Plateau Pressure:  [14 cmH20-15 cmH20] 15 cmH20  INTAKE / OUTPUT: I/O last 3 completed shifts: In:  1652 [I.V.:1652] Out: 2035 [Urine:1610; Emesis/NG output:425]  PHYSICAL EXAMINATION: General:  WD/WN Neuro:  Unresponsive to stimuli but on propofol sedation HEENT:  [place]/AT' sluggish pupillary reponse; ETT/OGT in situ Cardiovascular:  RRR, no M, G Lungs:  Clear bilaterally Abdomen:  Supple, no organomegaly Musculoskeletal:  No active joints Skin:  No C,C,E  LABS:  BMET Recent Labs  Lab 11/14/2017 1135 11/10/2017 1153 11/24/17 0246  NA 134* 138 137  K 4.1 4.1 3.8  CL 102 100* 109  CO2 19*  --  21*  BUN 11 13 9   CREATININE 1.43* 1.20 1.19  GLUCOSE 218* 221* 109*    Electrolytes Recent Labs  Lab 11/19/2017 1135 11/28/2017 1648 11/24/17 0246  CALCIUM 8.8*  --  8.1*  MG  --  1.8 1.8    CBC Recent Labs  Lab 11/11/2017 1135 11/26/2017 1153 11/24/17 0246  WBC 8.8  --  9.3  HGB 15.4 16.0 13.7  HCT 45.5 47.0 39.7  PLT 127*  --  120*    Coag's Recent Labs  Lab 11/22/2017 1148 11/08/2017 1648  APTT 28 184*  INR 1.21 1.35    Sepsis Markers Recent Labs  Lab 11/24/2017 1648  LATICACIDVEN 2.2*    ABG Recent Labs  Lab 11/14/2017 1310 11/24/17 0408  PHART 7.268* 7.441  PCO2ART 47.7 31.3*  PO2ART 156.0* 67.6*  Liver Enzymes Recent Labs  Lab 11/24/2017 1135  AST 27  ALT 17  ALKPHOS 76  BILITOT 0.8  ALBUMIN 3.9    Cardiac Enzymes Recent Labs  Lab 11/20/2017 1648 11/15/2017 2029 11/24/17 0246  TROPONINI 0.41* 0.29* 0.33*    Glucose Recent Labs  Lab 11/24/2017 1244 11/24/2017 1746 11/15/2017 1922 11/24/17 0002 11/24/17 0406 11/24/17 0757  GLUCAP 109* 90 79 110* 128* 105*    Imaging Ct Angio Head W Or Wo Contrast  Result Date: 11/22/2017 CLINICAL DATA:  76 year old male. Code stroke. CT perfusion demonstrating left MCA territory core infarct and penumbra. EXAM: CT ANGIOGRAPHY HEAD AND NECK TECHNIQUE: Multidetector CT imaging of the head and neck was performed using the standard protocol during bolus administration of intravenous contrast. Multiplanar CT  image reconstructions and MIPs were obtained to evaluate the vascular anatomy. Carotid stenosis measurements (when applicable) are obtained utilizing NASCET criteria, using the distal internal carotid diameter as the denominator. CONTRAST:  50 mL Isovue 370 COMPARISON:  CT perfusion 1211 hours today. Noncontrasthead CT 1158 hours today. FINDINGS: CTA NECK Skeleton: Degenerative changes in the cervical spine. No acute osseous abnormality identified. Visualized paranasal sinuses and mastoids are stable and well pneumatized. Upper chest: Bilateral main pulmonary artery emboli, with bulky thrombus at both hila. See series 5, image 198 on the left and image 206 on the right. Pulmonary thrombus extends into anterior upper lobe pulmonary artery branches bilaterally. The remaining chest is not included. Mild upper lobe pulmonary septal thickening. No confluent pulmonary opacity or pleural effusion. No superior mediastinal lymphadenopathy. Enteric tube courses through the visible esophagus. Other neck: Oral enteric tube in place. Enteric tube courses into the cervical esophagus. Endotracheal tube tip terminates in the trachea at the level of the clavicles. Small volume retained secretions in the trachea are row on the tube. Thyroid, parapharyngeal spaces, retropharyngeal space, sublingual space, submandibular spaces and parotid spaces are within normal limits. Fluid in the pharynx and posterior nasal cavity related to intubation. No cervical lymphadenopathy. No acute orbit or scalp soft tissue findings. Aortic arch: Abnormal bilateral main pulmonary arteries, as stated above. Three vessel arch configuration. No arch atherosclerosis or stenosis. Right carotid system: No brachiocephalic or right CCA origin plaque or stenosis. At the right carotid bifurcation there is mild soft and calcified plaque but no significant ICA origins stenosis. However, bulky soft plaque been tracks throughout the right ICA bulb with high-grade  stenosis at the level of the distal bulb numerically estimated at 70 % with respect to the distal vessel. See series 8, image 72. The right ICA remains patent, and is tortuous distal to the bulb but without additional plaque. Left carotid system: Normal left CCA origin. No left CCA plaque identified. Widely patent left carotid bifurcation. Somewhat unusual bulb type appearance of the proximal left ECA which is directed laterally (also series 8, image 73). Soft and calcified plaque in the medial right ICA origin and bulb, but no associated stenosis. Cervical left ICA then is within normal limits to the skullbase. Vertebral arteries: No proximal right subclavian artery stenosis despite calcified plaque. Normal right vertebral artery origin. Mildly non dominant right vertebral artery is patent to the skullbase without stenosis. Mild soft plaque in the proximal left subclavian artery without stenosis. Calcified plaque at the left vertebral artery origin and proximal V1 segment resulting in mild stenosis. Dominant left vertebral artery is otherwise patent and normal to the skullbase. CTA HEAD Posterior circulation: Dominant distal left vertebral artery. No distal vertebral stenosis. Both PICA  origins are patent. Patent vertebrobasilar junction. Somewhat diminutive basilar artery, but no basilar stenosis. Patent SCA origins. Fetal type bilateral PCA origins. Posterior communicating arteries appear normal. Bilateral PCA branches are within normal limits. Anterior circulation: Both ICA siphons are patent. There is mild bilateral calcified siphon plaque. No bilateral siphon stenosis. Normal ophthalmic and posterior communicating artery origins. Patent carotid termini. Normal MCA and ACA origins, the left A1 is dominant and the right is highly diminutive. Anterior communicating artery and bilateral ACA branches are normal. Right MCA M1 segment, bifurcation, and right MCA branches are within normal limits. The left MCA origin  and proximal M1 segment is patent but there is partial occlusion of the distal left MCA M1 and left MCA bifurcation. See series 10, image 19. There is little left MCA branch collateral enhancement in the early phase, mostly in the middle left MCA division. Venous sinuses: Early contrast bolus timing, the major dural venous sinuses appear grossly patent. Anatomic variants: Dominant left vertebral artery. Dominant left ACA A1 segment. Fetal type PCA origins. Review of the MIP images confirms the above findings IMPRESSION: 1. Positive for bilateral Acute Pulmonary Embolus. Partially visible but fairly bulky thrombus in the distal main pulmonary arteries at both hila. Critical Value/emergent results were called by telephone at the time of interpretation on 12-03-2017 at 1335 hours to Dr. Carmell Austria in the ED who verbally acknowledged these results. 2. Positive for Emergent Large Vessel Occlusion, distal Left MCA M1 segment. However, non-favorable appearance of collateral enhancement in the left MCA branches, and an estimated 78 mL core infarct on CT perfusion (at 1211 hours today). 3. 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 4. No other significant atherosclerotic stenosis in the head or neck. 5. ET tube and NG tube appear well placed. Electronically Signed   By: Odessa Fleming M.D.   On: 12-03-17 13:51   Ct Head Wo Contrast  Result Date: 12/03/2017 CLINICAL DATA:  Stroke follow-up EXAM: CT HEAD WITHOUT CONTRAST TECHNIQUE: Contiguous axial images were obtained from the base of the skull through the vertex without intravenous contrast. COMPARISON:  CTA head neck 12/03/17 FINDINGS: Brain: There is blurring of the gray-white interface throughout the left MCA distribution. There is no acute hemorrhage. No midline shift or significant mass effect. No hydrocephalus. Vascular: No hyperdense vessel or unexpected calcification. Skull: Normal  Sinuses/Orbits: Paranasal sinuses are clear. No mastoid or middle ear effusion. Normal orbits. Other: None IMPRESSION: Mild edema within the left middle cerebral artery distribution at the site of infarct demonstrated on the prior examination. No acute hemorrhage or mass effect. Electronically Signed   By: Deatra Robinson M.D.   On: 12/03/2017 23:25   Ct Angio Neck W Or Wo Contrast  Result Date: 12/03/17 CLINICAL DATA:  76 year old male. Code stroke. CT perfusion demonstrating left MCA territory core infarct and penumbra. EXAM: CT ANGIOGRAPHY HEAD AND NECK TECHNIQUE: Multidetector CT imaging of the head and neck was performed using the standard protocol during bolus administration of intravenous contrast. Multiplanar CT image reconstructions and MIPs were obtained to evaluate the vascular anatomy. Carotid stenosis measurements (when applicable) are obtained utilizing NASCET criteria, using the distal internal carotid diameter as the denominator. CONTRAST:  50 mL Isovue 370 COMPARISON:  CT perfusion 1211 hours today. Noncontrasthead CT 1158 hours today. FINDINGS: CTA NECK Skeleton: Degenerative changes in the cervical spine. No acute osseous abnormality identified. Visualized paranasal sinuses and mastoids are stable and well pneumatized.  Upper chest: Bilateral main pulmonary artery emboli, with bulky thrombus at both hila. See series 5, image 198 on the left and image 206 on the right. Pulmonary thrombus extends into anterior upper lobe pulmonary artery branches bilaterally. The remaining chest is not included. Mild upper lobe pulmonary septal thickening. No confluent pulmonary opacity or pleural effusion. No superior mediastinal lymphadenopathy. Enteric tube courses through the visible esophagus. Other neck: Oral enteric tube in place. Enteric tube courses into the cervical esophagus. Endotracheal tube tip terminates in the trachea at the level of the clavicles. Small volume retained secretions in the trachea  are row on the tube. Thyroid, parapharyngeal spaces, retropharyngeal space, sublingual space, submandibular spaces and parotid spaces are within normal limits. Fluid in the pharynx and posterior nasal cavity related to intubation. No cervical lymphadenopathy. No acute orbit or scalp soft tissue findings. Aortic arch: Abnormal bilateral main pulmonary arteries, as stated above. Three vessel arch configuration. No arch atherosclerosis or stenosis. Right carotid system: No brachiocephalic or right CCA origin plaque or stenosis. At the right carotid bifurcation there is mild soft and calcified plaque but no significant ICA origins stenosis. However, bulky soft plaque been tracks throughout the right ICA bulb with high-grade stenosis at the level of the distal bulb numerically estimated at 70 % with respect to the distal vessel. See series 8, image 72. The right ICA remains patent, and is tortuous distal to the bulb but without additional plaque. Left carotid system: Normal left CCA origin. No left CCA plaque identified. Widely patent left carotid bifurcation. Somewhat unusual bulb type appearance of the proximal left ECA which is directed laterally (also series 8, image 73). Soft and calcified plaque in the medial right ICA origin and bulb, but no associated stenosis. Cervical left ICA then is within normal limits to the skullbase. Vertebral arteries: No proximal right subclavian artery stenosis despite calcified plaque. Normal right vertebral artery origin. Mildly non dominant right vertebral artery is patent to the skullbase without stenosis. Mild soft plaque in the proximal left subclavian artery without stenosis. Calcified plaque at the left vertebral artery origin and proximal V1 segment resulting in mild stenosis. Dominant left vertebral artery is otherwise patent and normal to the skullbase. CTA HEAD Posterior circulation: Dominant distal left vertebral artery. No distal vertebral stenosis. Both PICA origins are  patent. Patent vertebrobasilar junction. Somewhat diminutive basilar artery, but no basilar stenosis. Patent SCA origins. Fetal type bilateral PCA origins. Posterior communicating arteries appear normal. Bilateral PCA branches are within normal limits. Anterior circulation: Both ICA siphons are patent. There is mild bilateral calcified siphon plaque. No bilateral siphon stenosis. Normal ophthalmic and posterior communicating artery origins. Patent carotid termini. Normal MCA and ACA origins, the left A1 is dominant and the right is highly diminutive. Anterior communicating artery and bilateral ACA branches are normal. Right MCA M1 segment, bifurcation, and right MCA branches are within normal limits. The left MCA origin and proximal M1 segment is patent but there is partial occlusion of the distal left MCA M1 and left MCA bifurcation. See series 10, image 19. There is little left MCA branch collateral enhancement in the early phase, mostly in the middle left MCA division. Venous sinuses: Early contrast bolus timing, the major dural venous sinuses appear grossly patent. Anatomic variants: Dominant left vertebral artery. Dominant left ACA A1 segment. Fetal type PCA origins. Review of the MIP images confirms the above findings IMPRESSION: 1. Positive for bilateral Acute Pulmonary Embolus. Partially visible but fairly bulky thrombus in the distal main  pulmonary arteries at both hila. Critical Value/emergent results were called by telephone at the time of interpretation on 11/22/2017 at 1335 hours to Dr. Carmell Austria in the ED who verbally acknowledged these results. 2. Positive for Emergent Large Vessel Occlusion, distal Left MCA M1 segment. However, non-favorable appearance of collateral enhancement in the left MCA branches, and an estimated 78 mL core infarct on CT perfusion (at 1211 hours today). 3. Positive also for bulky soft plaque in the Right ICA bulb with high-grade (estimated 70%) cervical right ICA  stenosis. 4. No other significant atherosclerotic stenosis in the head or neck. 5. ET tube and NG tube appear well placed. Electronically Signed   By: Odessa Fleming M.D.   On: 11/24/2017 13:51   Ct C-spine No Charge  Result Date: 11/30/2017 CLINICAL DATA:  76 year old male code stroke and fall. EXAM: CT CERVICAL SPINE WITHOUT CONTRAST TECHNIQUE: CT images of the cervical spine were reformatted off of the Neck CTA performed today. COMPARISON:  CTA head and neck today reported separately. FINDINGS: Alignment: Relatively preserved cervical lordosis. Mild degenerative appearing retrolisthesis of C4 on C5. Similar trace anterolisthesis of C7 on T1. Cervicothoracic junction alignment is within normal limits. Bilateral posterior element alignment is within normal limits. Skull base and vertebrae: Visualized skull base is intact. No atlanto-occipital dissociation. No cervical spine fracture. Soft tissues and spinal canal: Negative CT appearance of the cervical spinal canal aside from degenerative stenosis. Other neck CT and CTA findings today are reported separately. Disc levels: Moderate and severe cervical disc space loss with endplate spurring N5-A2 through C6-C7. Associated multilevel degenerative spinal stenosis at those disc levels. Stenosis is up to moderate at C4-C5. There is associated bilateral degenerative neural foraminal stenosis at those same levels, severe bilaterally at C4-C5 (bilateral C5 nerve level). Upper chest: Negative lung apices. See other chest findings today on the comparison CTA. IMPRESSION: 1.  No acute fracture in the cervical spine. 2. 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 Up to moderate spinal stenosis at C4-C5. Electronically Signed   By: Odessa Fleming M.D.   On: 11/04/2017 13:56   Ct Cerebral Perfusion W Contrast  Addendum Date: 11/14/2017   ADDENDUM REPORT: 11/12/2017 13:08 ADDENDUM: Study discussed by telephone with Dr. Caryl Pina on 11/16/2017 at 1306 hours.  Electronically Signed   By: Odessa Fleming M.D.   On: 11/21/2017 13:08   Result Date: 11/24/2017 CLINICAL DATA:  76 year old male code stroke. Right side weakness and leftward gaze. EXAM: CT PERFUSION BRAIN TECHNIQUE: Multiphase CT imaging of the brain was performed following IV bolus contrast injection. Subsequent parametric perfusion maps were calculated using RAPID software. CONTRAST:  40mL ISOVUE-370 IOPAMIDOL (ISOVUE-370) INJECTION 76% COMPARISON:  Noncontrast head CT 1158 hours today. FINDINGS: CT Brain Perfusion Findings: CBF (<30%) Volume: 78 mL Perfusion (Tmax>6.0s) volume: 115 mL Mismatch Volume: 37 mL Infarction Location:Left MCA territory. CTA head and neck is pending, the patient became 2 agitated following this exam to continue. Evaluation of the source CTP images suggest a lesion at the left MCA bifurcation, with delayed left sylvian fissure left MCA branch enhancement, and relative nonenhancement of posterior sylvian division branches. IMPRESSION: 1. CT Brain Perfusion detects a left MCA core infarct with estimated volume 78 mL. 2. Estimated brain penumbra volume is 115 mL. 3. CTA head and neck is pending, but CTP source images suggest a left MCA bifurcation and/or posterior M2 branch ELVO. Dr. Agnes Lawrence was paged to discuss at 1232 hours. Electronically Signed: By: Odessa Fleming M.D. On: 11/13/2017  12:37   Portable Chest Xray  Result Date: 11/24/2017 CLINICAL DATA:  Acute respiratory failure. EXAM: PORTABLE CHEST 1 VIEW COMPARISON:  Radiograph of November 23, 2017. FINDINGS: Stable cardiomediastinal silhouette. Endotracheal and nasogastric tubes are unchanged in position. No pneumothorax or pleural effusion is noted. No acute pulmonary disease is noted. Bony thorax is unremarkable. IMPRESSION: No acute cardiopulmonary abnormality seen. Electronically Signed   By: Lupita RaiderJames  Green Jr, M.D.   On: 11/24/2017 07:29   Dg Chest Portable 1 View  Result Date: 11/06/2017 CLINICAL DATA:  Code stroke.  Ventilator  support. EXAM: PORTABLE CHEST 1 VIEW COMPARISON:  Earlier same day FINDINGS: Endotracheal tube tip is 4 cm above the carina. Nasogastric tube enters the stomach. The lungs are clear. The vascularity is normal. No effusions. No significant bone finding. Density related to the first rib ends. IMPRESSION: Endotracheal tube and nasogastric tube well position. No active disease. Electronically Signed   By: Paulina FusiMark  Shogry M.D.   On: 11/19/2017 12:49   Dg Chest Portable 1 View  Result Date: 11/06/2017 CLINICAL DATA:  Code stroke.  Unresponsive.  Right-sided weakness. EXAM: PORTABLE CHEST 1 VIEW COMPARISON:  None. FINDINGS: Cardiac silhouette upper normal in size to mildly enlarged for AP portable technique. Thoracic aorta tortuous and mildly atherosclerotic. Prominent central pulmonary arteries. Opacity projecting over the medial right upper lobe may reflect calcified costal cartilage from the anterior right first rib overlapping with bronchovascular markings and the posterior fourth rib. Lungs otherwise clear. Left costophrenic angle excluded from the image. No visible pleural effusions. IMPRESSION: 1. Opacity projecting over the right upper lobe which may be artifactual. A follow-up chest x-ray when the patient is clinically able is recommended. 2. Otherwise, no acute cardiopulmonary disease. 3. Borderline to mild cardiomegaly without pulmonary edema. Electronically Signed   By: Hulan Saashomas  Lawrence M.D.   On: 11/03/2017 11:59   Ct Head Code Stroke Wo Contrast`  Result Date: 11/24/2017 CLINICAL DATA:  Code stroke. RIGHT-sided weakness. LEFT gaze preference. EXAM: CT HEAD WITHOUT CONTRAST TECHNIQUE: Contiguous axial images were obtained from the base of the skull through the vertex without intravenous contrast. COMPARISON:  None. FINDINGS: Brain: Significant motion degradation. Study is of marginal diagnostic utility. Suspected hypodensity involving LEFT internal capsule, LEFT insula, and LEFT M6 parietal cortex, all  LEFT MCA territory. Also hypodensity involving the LEFT occipital cortex, possible acute LEFT PCA territory infarct. Chronic infarct involving LEFT cerebellum. Generalized atrophy. Hypoattenuation of white matter, likely small vessel disease. No hemorrhage, mass lesion, or extra-axial fluid. Vascular: Atherosclerotic calcification of the carotid siphons. Suspected hyperdense LEFT ICA terminus and LEFT M1/M2 MCA. Skull: Normal. Negative for fracture or focal lesion. Sinuses/Orbits: No acute finding. Other: None. ASPECTS [facility]([place] Stroke Program Early CT Score) - Ganglionic level infarction (caudate, lentiform nuclei, internal capsule, insula, M1-M3 cortex): 5 - Supraganglionic infarction (M4-M6 cortex): 2 Total score (0-10 with 10 being normal): 7 IMPRESSION: 1. Motion degraded scan is of marginal diagnostic utility. Suspected emergent large vessel occlusion involving LEFT ICA and LEFT MCA. 2. ASPECTS is 7. Additional suspected acute stroke LEFT occipital lobe, Chronic LEFT cerebellar infarct, neither of which contribute to ASPECTS. These results were called by telephone at the time of interpretation on 11/17/2017 at 12:17 pm to Dr. Otelia LimesLindzen, who verbally acknowledged these results. Electronically Signed   By: Elsie StainJohn T Curnes M.D.   On: 11/03/2017 12:22     STUDIES:  Bubble ECHO performed today; results pending  LINES/TUBES: ETT/OGT/Foley  DISCUSSION: Recent MCA CVA being managed conservatively. On heparin for CVA  and PE.  ASSESSMENT / PLAN:  PULMONARY A: Adequate gas exchange on 40% FIO2 P:   Titrate vent to lowest effective settings  CARDIOVASCULAR A:  Hemodynamically stable P:  Follow. Check PTT today  RENAL A:   Normal indicies P:   Follow  GASTROINTESTINAL A:   Nl LFTs. No acute problems P:   Cont PPI  HEMATOLOGIC A:   Nl indices P:  Follow  ENDOCRINE A:   Elevated glu   P:   Cont SS  NEUROLOGIC A:   S/p MCA CVA P:   RASS goal: -1 Wean propofol as  tol  Critical Care time 30 min   Pulmonary and Critical Care Medicine [facility] Pager: [phone]  11/24/2017, 9:09 AM

## 2017-11-24 NOTE — Progress Notes (Signed)
STROKE TEAM PROGRESS NOTE   HISTORY OF PRESENT ILLNESS (per record) Zachary Greene is an 76 y.o. male who was found down [place] a hiking trail this morning unresponsive with right sided flaccidity. On arrival to the ED he was semiconscious and agitated with tachypnea, not responding to any commands. EMS on scene and initiated transport about 30 minutes prior to arrival (about 11 AM). LKN is not known. There is no prior history listed in Epic for review; also with no prior notes in Epic.    LSN: Not known tPA Given: No: LKN unknown.    SUBJECTIVE (INTERVAL HISTORY) His family is at bedside. Patient has quite a large left MCA stroke. Discussed with family. Will have to monotor carefully for edema.   OBJECTIVE Temp:  [98.5 F (36.9 C)-101.1 F (38.4 C)] 99.7 F (37.6 C) (12/22 0700) Pulse Rate:  [64-102] 68 (12/22 0758) Cardiac Rhythm: Normal sinus rhythm (12/22 0400) Resp:  [16-38] 21 (12/22 0758) BP: (85-175)/(65-110) 114/73 (12/22 0758) SpO2:  [66 %-100 %] 97 % (12/22 0758) FiO2 (%):  [40 %-100 %] 40 % (12/22 0758) Weight:  [175 lb 7.8 oz (79.6 kg)-176 lb 5.9 oz (80 kg)] 175 lb 7.8 oz (79.6 kg) (12/22 0436)  CBC:  Recent Labs  Lab 11/18/2017 1135 11/09/2017 1153 11/24/17 0246  WBC 8.8  --  9.3  NEUTROABS 4.7  --   --   HGB 15.4 16.0 13.7  HCT 45.5 47.0 39.7  MCV 98.3  --  95.2  PLT 127*  --  120*    Basic Metabolic Panel:  Recent Labs  Lab 11/06/2017 1135 11/28/2017 1153 11/11/2017 1648 11/24/17 0246  NA 134* 138  --  137  K 4.1 4.1  --  3.8  CL 102 100*  --  109  CO2 19*  --   --  21*  GLUCOSE 218* 221*  --  109*  BUN 11 13  --  9  CREATININE 1.43* 1.20  --  1.19  CALCIUM 8.8*  --   --  8.1*  MG  --   --  1.8 1.8    Lipid Panel:     Component Value Date/Time   CHOL 159 11/24/2017 0246   TRIG 66 11/24/2017 0246   HDL 42 11/24/2017 0246   CHOLHDL 3.8 11/24/2017 0246   VLDL 13 11/24/2017 0246   LDLCALC 104 (H) 11/24/2017 0246   HgbA1c:  Lab Results  Component  Value Date   HGBA1C 5.5 11/05/2017   Urine Drug Screen:     Component Value Date/Time   LABOPIA NONE DETECTED 11/28/2017 1136   COCAINSCRNUR NONE DETECTED 11/24/2017 1136   LABBENZ NONE DETECTED 11/11/2017 1136   AMPHETMU NONE DETECTED 11/24/2017 1136   THCU NONE DETECTED 12/01/2017 1136   LABBARB NONE DETECTED 11/04/2017 1136    Alcohol Level     Component Value Date/Time   ETH <10 11/15/2017 1148    IMAGING   Ct Angio Head W Or Wo Contrast Ct Angio Neck W Or Wo Contrast 12/03/2017 IMPRESSION:  1. Positive for bilateral Acute Pulmonary Embolus. Partially visible but fairly bulky thrombus in the distal main pulmonary arteries at both hila. Critical Value/emergent results were called by telephone at the time of interpretation on 11/06/2017 at 1335 hours to Dr. Carmell AustriaNate Pickering in the ED who verbally acknowledged these results.  2. Positive for Emergent Large Vessel Occlusion, distal Left MCA M1 segment. However, non-favorable appearance of collateral enhancement in the left MCA branches, and an estimated 78  mL core infarct on CT perfusion (at 1211 hours today).  3. 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  4. No other significant atherosclerotic stenosis in the head or neck.  5. ET tube and NG tube appear well placed.    Ct Head Wo Contrast 11/04/2017 IMPRESSION:  Mild edema within the left middle cerebral artery distribution at the site of infarct demonstrated on the prior examination. No acute hemorrhage or mass effect.    Ct C-spine No Charge 11/19/2017 IMPRESSION:  1.  No acute fracture in the cervical spine.  2. 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 Up to moderate spinal stenosis at C4-C5.     Ct Cerebral Perfusion W Contrast 11/22/2017 IMPRESSION:  1. CT Brain Perfusion detects a left MCA core infarct with estimated volume 78 mL.  2. Estimated brain penumbra volume  is 115 mL.  3. CTA head and neck is pending, but CTP source images suggest a left MCA bifurcation and/or posterior M2 branch ELVO.    Portable Chest Xray 11/24/2017 IMPRESSION:  No acute cardiopulmonary abnormality seen.    Dg Chest Portable 1 View  11/29/2017 IMPRESSION:  Endotracheal tube and nasogastric tube well position. No active disease.    Dg Chest Portable 1 View 11/28/2017 IMPRESSION:  1. Opacity projecting over the right upper lobe which may be artifactual. A follow-up chest x-ray when the patient is clinically able is recommended.  2. Otherwise, no acute cardiopulmonary disease.  3. Borderline to mild cardiomegaly without pulmonary edema.    Ct Head Code Stroke Wo Contrast` 11/06/2017 IMPRESSION:  1. Motion degraded scan is of marginal diagnostic utility.   Suspected emergent large vessel occlusion involving LEFT ICA and LEFT MCA.  2. ASPECTS is 7.   Additional suspected acute stroke LEFT occipital lobe,   Chronic LEFT cerebellar infarct, neither of which contribute to ASPECTS.       Transthoracic Echocardiogram - pending 00/00/00    Bilateral Carotid Dopplers - pending 00/00/00    Bilateral Lower Extremity Venous Dopplers - pending 00/00/00     PHYSICAL EXAM Vitals:   11/24/17 0500 11/24/17 0600 11/24/17 0700 11/24/17 0758  BP: 94/66 112/74 106/67 114/73  Pulse: 64 65 65 68  Resp: 18 (!) 21 20 (!) 21  Temp: 100 F (37.8 C) 99.9 F (37.7 C) 99.7 F (37.6 C)   TempSrc:      SpO2: 92% 95% 96% 97%  Weight:      Height:        PHYSICAL EXAM Ellderly male who is intubated . Afebrile. Head is nontraumatic. Neck is supple without bruit. Cardiac exam no murmur or gallop. Lungs are clear to auscultation. Distal pulses are well felt. Neurological Exam : Intubated. Does not open eyes to sternal rub but grimaces. Globally aphasic. Left gaze deviation. Unable to look to the right past midline. Does not blink to threat. Right lower facial  weakness. Does not follow any commands. Tongue midline. Cough and gag weak. Motor system exam reveals spontaneous left upper and lower extremity movements. Right upper extremity with minimum spontaneous movement. Withdraws right lower extremity to pain but less than the left side. Right plantar upgoing left downgoing. Sensation diminished in the right upper extremity.  ASSESSMENT/PLAN Zachary Greene is a 76 y.o. male with an unknown past medical history presenting with unresponsiveness and right sided flaccidity.  He did not receive IV t-PA due to unknown time of onset.  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 Likely embolic from an unknown source possible AFib as this was caught on tele last evening  Resultant  Right hemiparesis, encephalopathy, intubated and sedated  CT head - large vessel occlusion involving LEFT ICA and LEFT MCA. Suspected acute stroke LEFT occipital lobe. Old left cerebellar infarct.  MRI head - not performed  MRA head - not performed  Carotid Doppler - pending  2D Echo - pending  LDL - 104  HgbA1c - 5.5  VTE prophylaxis - IV heparin Diet NPO time specified  Unknown prior to admission, now on heparin IV  Patient will be counseled to be compliant with his antithrombotic medications  Ongoing aggressive stroke risk factor management  Therapy recommendations:  pending  Disposition:  Pending  Hypertension  Blood pressure running somewhat low  Permissive hypertension (OK if < 220/120) but gradually normalize in 5-7 days  Long-term BP goal normotensive  Hyperlipidemia  Home meds: Unknown  LDL 104, goal < 70  Add Lipitor 40 mg daily when access is available.  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   Hospital day # 1  This patient is critically ill and at significant risk of neurological worsening, death and care requires constant monitoring of vital signs, hemodynamics,respiratory and cardiac monitoring,review of multiple databases, neurological assessment, discussion with family, other specialists and medical decision making of high complexity.I  I spent 30 minutes of neurocritical care time in the care of this patient.  Naomie DeanAntonia Caillou Minus, MD [facility]  To contact Stroke Continuity provider, please refer to [email or web address]. After hours, contact General Neurology

## 2017-11-24 NOTE — Progress Notes (Signed)
  Echocardiogram 2D Echocardiogram with bubble stury has been performed.  Zachary Greene, Kasondra Junod M 11/24/2017, 8:44 AM

## 2017-11-24 NOTE — Progress Notes (Signed)
Progress Note:  Called by nurse to report increasingly bloody return form OGT. Repeat PTT today 105/0.53 heparin units.  A: Somewhat over anti-coagulated. P: Hold heparin for 1 hour then resume at 1000U/hr. Recheck PTT at 19:00 today.

## 2017-11-24 NOTE — Progress Notes (Signed)
OT Cancellation Note  Patient Details Name: Zachary RedderWinfred S Greene MRN: [id number] DOB: 06/11/1941   Cancelled Treatment:    Reason Eval/Treat Not Completed: Patient not medically ready pt intubated on propofol and RN reports pt not medically appropriate)    [name], [name] [phone] 11/24/2017, 9:08 AM

## 2017-11-24 NOTE — Progress Notes (Signed)
This was a page for family members concern about DNR for pt transitioning. Family in emotional distress. Chaplain  Looked for attending doctor who came in and clarified to family about the DNR process. Family very appreciative. Chaplain also offered compassionate presence.  Kalev Temme- Chaplain.

## 2017-11-24 NOTE — Progress Notes (Signed)
SLP Cancellation Note  Patient Details Name: Zachary RedderWinfred S Elms MRN: [id number] DOB: [birth year]   Cancelled treatment:       Reason Eval/Treat Not Completed: Patient not medically ready. Pt intubated. Will f/u next date if extubated.  Rondel BatonMary Beth Savreen Gebhardt, [place]MS, CCC-SLP Speech-Language Pathologist [phone]   Arlana LindauMary E Arcenio Mullaly 11/24/2017, 7:41 AM

## 2017-11-24 NOTE — Progress Notes (Signed)
PT Cancellation Note  Patient Details Name: Donley RedderWinfred S Betsch MRN: [id number] DOB: [birth year]   Cancelled Treatment:    Reason Eval/Treat Not Completed: Medical issues which prohibited therapy(pt intubated on propofol and RN reports pt not medically appropriate)   Moselle Rister B Letti Towell 11/24/2017, 9:01 AM  Delaney MeigsMaija Tabor [place], PT [phone]

## 2017-11-24 NOTE — Progress Notes (Signed)
ANTICOAGULATION CONSULT NOTE - Follow Up Consult  Pharmacy Consult for Heparin  Indication: pulmonary embolus  No Known Allergies  Patient Measurements: Height: 6' (182.9 cm) Weight: 175 lb 7.8 oz (79.6 kg) IBW/kg (Calculated) : 77.6  Vital Signs: Temp: 100.6 F (38.1 C) (12/22 1200) Temp Source: Core (12/22 0800) BP: 108/71 (12/22 1213) Pulse Rate: 76 (12/22 1213)  Labs: Recent Labs    2017-12-02 1135 2017-12-02 1148 2017-12-02 1153 2017-12-02 1648 2017-12-02 2029 2017-12-02 2348 11/24/17 0246 11/24/17 1214  HGB 15.4  --  16.0  --   --   --  13.7  --   HCT 45.5  --  47.0  --   --   --  39.7  --   PLT 127*  --   --   --   --   --  120*  --   APTT  --  28  --  184*  --   --   --   --   LABPROT  --  15.2  --  16.6*  --   --   --   --   INR  --  1.21  --  1.35  --   --   --   --   HEPARINUNFRC  --   --   --   --   --  0.59  --  0.53  CREATININE 1.43*  --  1.20  --   --   --  1.19  --   CKTOTAL  --   --   --  255  --   --   --   --   CKMB  --   --   --  9.2*  --   --   --   --   TROPONINI  --   --   --  0.41* 0.29*  --  0.33*  --     Estimated Creatinine Clearance: 58.9 mL/min (by C-G formula based on SCr of 1.19 mg/dL).  Assessment: [age] yom with acute PE and concurrent acute ischemic CVA continues on IV heparin. Heparin level is slightly above goal. CBC is stable and no bleeding noted.   Goal of Therapy:  Heparin level 0.3-0.5 units/ml Monitor platelets by anticoagulation protocol: Yes   Plan:  Reduce heparin gtt slightly to 1300 units/hr - no further boluses Daily heparin level and CBC  Kyung Muto, Drake Leachachel Lynn 11/24/2017,1:19 PM

## 2017-11-25 ENCOUNTER — Encounter (HOSPITAL_COMMUNITY): Payer: Self-pay

## 2017-11-25 ENCOUNTER — Inpatient Hospital Stay (HOSPITAL_COMMUNITY): Payer: Medicare Other

## 2017-11-25 DIAGNOSIS — Z515 Encounter for palliative care: Secondary | ICD-10-CM

## 2017-11-25 DIAGNOSIS — I2699 Other pulmonary embolism without acute cor pulmonale: Secondary | ICD-10-CM

## 2017-11-25 DIAGNOSIS — I63312 Cerebral infarction due to thrombosis of left middle cerebral artery: Principal | ICD-10-CM

## 2017-11-25 DIAGNOSIS — J9600 Acute respiratory failure, unspecified whether with hypoxia or hypercapnia: Secondary | ICD-10-CM

## 2017-11-25 LAB — CBC
HEMATOCRIT: 39 % (ref 39.0–52.0)
HEMOGLOBIN: 13.1 g/dL (ref 13.0–17.0)
MCH: 32.4 pg (ref 26.0–34.0)
MCHC: 33.6 g/dL (ref 30.0–36.0)
MCV: 96.5 fL (ref 78.0–100.0)
PLATELETS: 127 10*3/uL — AB (ref 150–400)
RBC: 4.04 MIL/uL — AB (ref 4.22–5.81)
RDW: 13 % (ref 11.5–15.5)
WBC: 9.5 10*3/uL (ref 4.0–10.5)

## 2017-11-25 LAB — GLUCOSE, CAPILLARY
Glucose-Capillary: 115 mg/dL — ABNORMAL HIGH (ref 65–99)
Glucose-Capillary: 85 mg/dL (ref 65–99)

## 2017-11-25 LAB — HEPARIN LEVEL (UNFRACTIONATED): Heparin Unfractionated: 0.18 IU/mL — ABNORMAL LOW (ref 0.30–0.70)

## 2017-11-25 MED ORDER — LORAZEPAM 2 MG/ML IJ SOLN
1.0000 mg | INTRAMUSCULAR | Status: DC | PRN
  Administered 2017-11-25 (×3): 1 mg via INTRAVENOUS
  Filled 2017-11-25 (×3): qty 1

## 2017-11-25 MED ORDER — SODIUM CHLORIDE 0.9% FLUSH
3.0000 mL | Freq: Two times a day (BID) | INTRAVENOUS | Status: DC
  Administered 2017-11-25 (×2): 3 mL via INTRAVENOUS

## 2017-11-25 MED ORDER — ONDANSETRON 4 MG PO TBDP: 4.0000 mg | ORAL_TABLET | Freq: Four times a day (QID) | ORAL | Status: DC | PRN

## 2017-11-25 MED ORDER — MORPHINE BOLUS VIA INFUSION
1.0000 mg | INTRAVENOUS | Status: DC | PRN
  Filled 2017-11-25: qty 2

## 2017-11-25 MED ORDER — ONDANSETRON HCL 4 MG/2ML IJ SOLN: 4.0000 mg | Freq: Four times a day (QID) | INTRAMUSCULAR | Status: DC | PRN

## 2017-11-25 MED ORDER — GLYCOPYRROLATE 0.2 MG/ML IJ SOLN
0.2000 mg | INTRAMUSCULAR | Status: DC
  Administered 2017-11-25 (×2): 0.2 mg via INTRAVENOUS
  Filled 2017-11-25 (×2): qty 1

## 2017-11-25 MED ORDER — ATROPINE SULFATE 1 % OP SOLN
4.0000 [drp] | OPHTHALMIC | Status: DC | PRN
  Administered 2017-11-25 (×2): 4 [drp] via SUBLINGUAL
  Filled 2017-11-25: qty 2

## 2017-11-25 MED ORDER — KCL IN DEXTROSE-NACL 30-5-0.45 MEQ/L-%-% IV SOLN
INTRAVENOUS | Status: DC
  Administered 2017-11-25: 11:00:00 via INTRAVENOUS
  Filled 2017-11-25: qty 1000

## 2017-11-25 MED ORDER — SODIUM CHLORIDE 0.9 % IV SOLN
10.0000 mg/h | INTRAVENOUS | Status: DC
  Administered 2017-11-25: 1 mg/h via INTRAVENOUS
  Filled 2017-11-25: qty 10

## 2017-11-25 MED ORDER — GLYCOPYRROLATE 0.2 MG/ML IJ SOLN
0.2000 mg | INTRAMUSCULAR | Status: DC | PRN
  Administered 2017-11-25: 0.2 mg via INTRAVENOUS
  Filled 2017-11-25: qty 1

## 2017-11-26 NOTE — Progress Notes (Signed)
Patient transported to morgue. Post mortem checklist completed. Patient placement notified.

## 2017-11-28 ENCOUNTER — Telehealth: Payer: Self-pay

## 2017-11-28 NOTE — Telephone Encounter (Signed)
On 11/28/17 I [facility] a [facility] The d/c is for cremation. The patient is a patient of Doctor Crim. The d/c will be taken to Pulmonary Unit @ [facility] tomorrow am for signature.  On 11/30/17 I received the d/c back from Doctor GardenaSood. I got the d/c ready and called the funeral home to let them know the d/c is ready for pickup.

## 2017-11-28 NOTE — Telephone Encounter (Signed)
On 11/28/17 I received a [facility] from [facility] (fax[facility]),. The d/c is for cremation. The patient is a patient of Courtney Crim. The d/c will be taken to Pulmonary Unit for signature.  On 11/29/17 I received the d/c back from Doctor Craige CottaSood who signed the d/c for [employer]. I got the d/c ready and faxed the d/c to the funeral home per the funeral home request.

## 2017-12-04 NOTE — Progress Notes (Signed)
PULMONARY / CRITICAL CARE MEDICINE   Name: Zachary Greene MRN: [id number] DOB: Nov 22, 1941    ADMISSION DATE:  11/29/2017 CONSULTATION DATE:  11/15/2017  REFERRING MD:  Rubin PayorPickering  CHIEF COMPLAINT:  Acute MCA CVA with acute pulm emboli  HISTORY OF PRESENT ILLNESS:   This is a 77 year old male who lives independently. Last seen by family around thanksgiving. Apparently works, runs in park every day. Was found down in park unresponsive on walking trail w/ right sided weakness. Down time unknown. Was brought to ER. Found to be hypoxic and agitated so was intubated and placed on vent. CT head showing massiveacute left MCA stroke, and incidentally bottom cuts of CT imaging demonstrated acute pulmonary emboli. The core size of the stroke was felt to large for neuro interventional procedure.  PAST MEDICAL HISTORY :  He  has no past medical history on file.  PAST SURGICAL HISTORY: He  has no past surgical history on file.  No Known Allergies  No current facility-administered medications on file prior to encounter.    No current outpatient medications on file prior to encounter.    FAMILY HISTORY:  His has no family status information on file.    SOCIAL HISTORY: He  reports that  has never smoked. He does not have any smokeless tobacco history on file. He reports that he does not drink alcohol or use drugs.  REVIEW OF SYSTEMS:   Unobtainable  SUBJECTIVE:  Unresponsdiver  VITAL SIGNS: BP 103/72   Pulse (!) 55   Temp 99.1 F (37.3 C)   Resp 16   Ht 6' (1.829 m)   Wt 79.4 kg (175 lb 0.7 oz)   SpO2 99%   BMI 23.74 kg/m   HEMODYNAMICS:    VENTILATOR SETTINGS: Vent Mode: PRVC FiO2 (%):  [40 %] 40 % Set Rate:  [16 bmp] 16 bmp Vt Set:  [630 mL] 630 mL PEEP:  [5 cmH20] 5 cmH20 Pressure Support:  [5 cmH20] 5 cmH20 Plateau Pressure:  [13 cmH20-16 cmH20] 15 cmH20  INTAKE / OUTPUT: I/O last 3 completed shifts: In: 3955.8 [I.V.:3955.8] Out: 3265 [Urine:2390; Emesis/NG  output:875]  PHYSICAL EXAMINATION: General:  WD/WN NAD Neuro:  Unresponsive  HEENT:  [place]/AT' minimally reactive pupils; ETT/OGT in situ Cardiovascular:  RRR, no murmurs Lungs:  CTA Abdomen:  Supple; no organomegaly, masses Musculoskeletal:  No active joints Skin:  No C,C,E  LABS:  BMET Recent Labs  Lab 11/19/2017 1135 11/22/2017 1153 11/24/17 0246  NA 134* 138 137  K 4.1 4.1 3.8  CL 102 100* 109  CO2 19*  --  21*  BUN 11 13 9   CREATININE 1.43* 1.20 1.19  GLUCOSE 218* 221* 109*    Electrolytes Recent Labs  Lab 11/26/2017 1135 11/03/2017 1648 11/24/17 0246  CALCIUM 8.8*  --  8.1*  MG  --  1.8 1.8    CBC Recent Labs  Lab 11/24/2017 1135 11/27/2017 1153 11/24/17 0246 03-29-17 0317  WBC 8.8  --  9.3 9.5  HGB 15.4 16.0 13.7 13.1  HCT 45.5 47.0 39.7 39.0  PLT 127*  --  120* 127*    Coag's Recent Labs  Lab 11/26/2017 1148 11/13/2017 1648 11/24/17 1214 11/24/17 1816  APTT 28 184* 105* 38*  INR 1.21 1.35  --   --     Sepsis Markers Recent Labs  Lab 11/11/2017 1648  LATICACIDVEN 2.2*    ABG Recent Labs  Lab 11/24/2017 1310 11/24/17 0408  PHART 7.268* 7.441  PCO2ART 47.7 31.3*  PO2ART  156.0* 67.6*    Liver Enzymes Recent Labs  Lab 11/03/2017 1135  AST 27  ALT 17  ALKPHOS 76  BILITOT 0.8  ALBUMIN 3.9    Cardiac Enzymes Recent Labs  Lab 11/03/2017 1648 12/02/2017 2029 11/24/17 0246  TROPONINI 0.41* 0.29* 0.33*    Glucose Recent Labs  Lab 11/24/17 1206 11/24/17 1623 11/24/17 1922 11/24/17 2333 Feb 20, 2017 0339 Feb 20, 2017 0739  GLUCAP 102* 135* 100* 100* 115* 85    Imaging Ct Head Wo Contrast  Result Date: 2017/02/05 CLINICAL DATA:  Stroke follow-up EXAM: CT HEAD WITHOUT CONTRAST TECHNIQUE: Contiguous axial images were obtained from the base of the skull through the vertex without intravenous contrast. COMPARISON:  Head CT 11/15/2017 FINDINGS: Brain: Cytotoxic edema throughout the left MCA distribution has markedly increased from the prior study.  There is greater mass effect on left lateral ventricle with midline shift to the right that measures approximately 3 mm. There is a small focus of intraparenchymal hematoma at the left for for striated measuring 2.2 x 1.1 cm. No hydrocephalus. Basal cisterns are patent. Vascular: No hyperdense vessel or unexpected calcification. Skull: Normal visualized skull base, calvarium and extracranial soft tissues. Sinuses/Orbits: No sinus fluid levels or advanced mucosal thickening. No mastoid effusion. Normal orbits. IMPRESSION: 1. Small new intraparenchymal hematoma within the left basal ganglia measuring 2.2 x 1.1 cm. Increased cytotoxic edema throughout the left MCA territory with newly developed 3 mm of rightward midline shift. 2. No hydrocephalus or herniation. These results were called by telephone at the time of interpretation on 2017/02/05 at 5:50 am to [facility], who verbally acknowledged these results. Electronically Signed   By: Deatra RobinsonKevin  Herman M.D.   On: 2017/02/05 05:53   Dg Abd Portable 1v  Result Date: 11/24/2017 CLINICAL DATA:  OG tube placement EXAM: PORTABLE ABDOMEN - 1 VIEW COMPARISON:  None. FINDINGS: OG tube tip is in the proximal stomach with the side port in the distal esophagus. IMPRESSION: As above. Electronically Signed   By: Charlett NoseKevin  Dover M.D.   On: 11/24/2017 14:17     STUDIES:  Repeat CT shows large left MCA CVA with hemorrhagic conversion  SIGNIFICANT EVENTS: As above  LINES/TUBES: ETT/OGT/Foley  DISCUSSION: Hemorrhagic conversion of CVA worsens prognosis. Per Neurology suggestion will obtain palliative consult.  ASSESSMENT / PLAN:  PULMONARY A: Adequate O2 sats on vent and based on last ABG was ventilating adequately P:   No change in settings and with neuro status is not weanable.  CARDIOVASCULAR A:  Hemodyn stable P:  Maintenance fluids  RENAL A:   Adequate I/Os P:   Switch fluids to D5/1/2 NS. Check BMP today  GASTROINTESTINAL A:   Coffee-ground  emesis stopped after heparin d/c. Minimal bilious drainage with low Gomco P:   Continue PPI. Clamp OGT  HEMATOLOGIC A:   CBC OK P:  Observe  NEUROLOGIC A:   Hemorrhagic CVA conversion P:   Palliative consult  Critical care time: 35 min.  Pulmonary and Critical Care Medicine [facility] Pager: [phone]  2017/02/05, 8:06 AM

## 2017-12-04 NOTE — Progress Notes (Signed)
OT Cancellation    2017/03/03 0700  OT Visit Information  Last OT Received On 2017/03/03  Reason Eval/Treat Not Completed Patient not medically ready. Pt intubated and on propofol. Will follow up for OT eval as schedule allows and pt medically ready. Thank you.   Cydney Alvarenga MSOT, OTR/L Acute Rehab Pager: [phone] Office: [phone]

## 2017-12-04 NOTE — Progress Notes (Signed)
STROKE TEAM PROGRESS NOTE   HISTORY OF PRESENT ILLNESS (per record) Zachary Greene is an 77 y.o. male who was [place] this [place] unresponsive with right sided flaccidity. On arrival to the ED he was semiconscious and agitated with tachypnea, not responding to any commands. EMS on scene and initiated transport about 30 minutes prior to arrival (about 11 AM). LKN is not known. There is no prior history listed in Epic for review; also with no prior notes in Epic.    LSN: Not known tPA Given: No: LKN unknown.    SUBJECTIVE (INTERVAL HISTORY) His family is at bedside. Patient has quite a large left MCA stroke. Discussed with family. Family leaning towards comfort care OBJECTIVE Temp:  [99 F (37.2 C)-102 F (38.9 C)] 100.6 F (38.1 C) (12/23 1200) Pulse Rate:  [48-125] 55 (12/23 1200) Cardiac Rhythm: Sinus bradycardia (12/23 0800) Resp:  [15-23] 16 (12/23 1200) BP: (88-132)/(63-91) 108/72 (12/23 1200) SpO2:  [92 %-100 %] 100 % (12/23 1200) FiO2 (%):  [40 %] 40 % (12/23 1134) Weight:  [169 lb 1.5 oz (76.7 kg)-175 lb 0.7 oz (79.4 kg)] 175 lb 0.7 oz (79.4 kg) (12/23 1201)  CBC:  Recent Labs  Lab 12-06-2016 1135  11/24/17 0246 11/04/2017 0317  WBC 8.8  --  9.3 9.5  NEUTROABS 4.7  --   --   --   HGB 15.4   < > 13.7 13.1  HCT 45.5   < > 39.7 39.0  MCV 98.3  --  95.2 96.5  PLT 127*  --  120* 127*   < > = values in this interval not displayed.    Basic Metabolic Panel:  Recent Labs  Lab 12-06-2016 1135 12-06-2016 1153 12-06-2016 1648 11/24/17 0246  NA 134* 138  --  137  K 4.1 4.1  --  3.8  CL 102 100*  --  109  CO2 19*  --   --  21*  GLUCOSE 218* 221*  --  109*  BUN 11 13  --  9  CREATININE 1.43* 1.20  --  1.19  CALCIUM 8.8*  --   --  8.1*  MG  --   --  1.8 1.8    Lipid Panel:     Component Value Date/Time   CHOL 159 11/24/2017 0246   TRIG 66 11/24/2017 0246   HDL 42 11/24/2017 0246   CHOLHDL 3.8 11/24/2017 0246   VLDL 13 11/24/2017 0246   LDLCALC  104 (H) 11/24/2017 0246   HgbA1c:  Lab Results  Component Value Date   HGBA1C 5.5 10/14/2017   Urine Drug Screen:     Component Value Date/Time   LABOPIA NONE DETECTED 10/14/2017 1136   COCAINSCRNUR NONE DETECTED 10/14/2017 1136   LABBENZ NONE DETECTED 10/14/2017 1136   AMPHETMU NONE DETECTED 10/14/2017 1136   THCU NONE DETECTED 10/14/2017 1136   LABBARB NONE DETECTED 10/14/2017 1136    Alcohol Level     Component Value Date/Time   ETH <10 10/14/2017 1148    IMAGING   Ct Angio Head W Or Wo Contrast Ct Angio Neck W Or Wo Contrast 10/14/2017 IMPRESSION:  1. Positive for bilateral Acute Pulmonary Embolus. Partially visible but fairly bulky thrombus in the distal main pulmonary arteries at both hila. Critical Value/emergent results were called by telephone at the time of interpretation on 10/14/2017 at 1335 hours to Dr. Carmell AustriaNate Pickering in the ED who verbally acknowledged these results.  2. Positive for Emergent Large Vessel Occlusion,  distal Left MCA M1 segment. However, non-favorable appearance of collateral enhancement in the left MCA branches, and an estimated 78 mL core infarct on CT perfusion (at 1211 hours today).  3. 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  4. No other significant atherosclerotic stenosis in the head or neck.  5. ET tube and NG tube appear well placed.    Ct Head Wo Contrast 11/09/2017 IMPRESSION:  Mild edema within the left middle cerebral artery distribution at the site of infarct demonstrated on the prior examination. No acute hemorrhage or mass effect.    Ct C-spine No Charge 11/12/2017 IMPRESSION:  1.  No acute fracture in the cervical spine.  2. 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 Up to moderate spinal stenosis at C4-C5.     Ct Cerebral Perfusion W Contrast 11/15/2017 IMPRESSION:  1. CT Brain Perfusion detects a left MCA core infarct with  estimated volume 78 mL.  2. Estimated brain penumbra volume is 115 mL.  3. CTA head and neck is pending, but CTP source images suggest a left MCA bifurcation and/or posterior M2 branch ELVO.    Portable Chest Xray 11/24/2017 IMPRESSION:  No acute cardiopulmonary abnormality seen.    Dg Chest Portable 1 View  11/14/2017 IMPRESSION:  Endotracheal tube and nasogastric tube well position. No active disease.    Dg Chest Portable 1 View 11/10/2017 IMPRESSION:  1. Opacity projecting over the right upper lobe which may be artifactual. A follow-up chest x-ray when the patient is clinically able is recommended.  2. Otherwise, no acute cardiopulmonary disease.  3. Borderline to mild cardiomegaly without pulmonary edema.    Ct Head Code Stroke Wo Contrast` 11/03/2017 IMPRESSION:  1. Motion degraded scan is of marginal diagnostic utility.   Suspected emergent large vessel occlusion involving LEFT ICA and LEFT MCA.  2. ASPECTS is 7.   Additional suspected acute stroke LEFT occipital lobe,   Chronic LEFT cerebellar infarct, neither of which contribute to ASPECTS.       Transthoracic Echocardiogram - pending 00/00/00    Bilateral Carotid Dopplers - pending 00/00/00    Bilateral Lower Extremity Venous Dopplers - pending       PHYSICAL EXAM Vitals:   November 24, 2017 1100 November 24, 2017 1134 November 24, 2017 1200 November 24, 2017 1201  BP: 113/64 113/73 108/72   Pulse: (!) 51 60 (!) 55   Resp: 16 16 16    Temp: 100.2 F (37.9 C)  (!) 100.6 F (38.1 C)   TempSrc:      SpO2: 100% 99% 100%   Weight:    175 lb 0.7 oz (79.4 kg)  Height:        PHYSICAL EXAM Ellderly male who is intubated . Afebrile. Head is nontraumatic. Neck is supple without bruit. Cardiac exam no murmur or gallop. Lungs are clear to auscultation. Distal pulses are well felt. Neurological Exam : Intubated. Does not open eyes to sternal rub but grimaces. Globally aphasic. Left gaze deviation. Unable to look to the right past  midline. Does not blink to threat. Right lower facial weakness. Does not follow any commands. Tongue midline. Cough and gag weak. Motor system exam reveals spontaneous left upper and lower extremity movements. Right upper extremity with minimum spontaneous movement. Withdraws right lower extremity to pain but less than the left side. Right plantar upgoing left downgoing. Sensation diminished in the right upper extremity.  ASSESSMENT/PLAN Mr. Donley RedderWinfred S Szafranski is a 77 y.o. male with an unknown past  medical history presenting with unresponsiveness and right sided flaccidity.  He did not receive IV t-PA due to unknown time of onset.  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 Likely embolic from an unknown source possible AFib as this was caught on telemetry 09/23/17 evening  Resultant  Right hemiparesis, encephalopathy, intubated and sedated  CT head - large vessel occlusion involving LEFT ICA and LEFT MCA. Suspected acute stroke LEFT occipital lobe. Old left cerebellar infarct.  MRI head - not performed  MRA head - not performed  Carotid Doppler - pending  2D Echo - pending  LDL - 104  HgbA1c - 5.5  VTE prophylaxis - IV heparin Diet NPO time specified  Unknown prior to admission, now on heparin IV  Patient will be counseled to be compliant with his antithrombotic medications  Ongoing aggressive stroke risk factor management  Therapy recommendations:  pending  Disposition:  Pending  Hypertension  Blood pressure running somewhat low  Permissive hypertension (OK if < 220/120) but gradually normalize in 5-7 days  Long-term BP goal normotensive  Hyperlipidemia  Home meds: Unknown  LDL 104, goal < 70  Add Lipitor 40 mg daily when access is available.  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 Family is quite clear that patient would not have wanted to live like this and want to withdraw ventilatory support and change to palliative comfort care only. I have discussed the case with Dr. Roselyn Bering from pulmonary critical care medicine who is also in agreement. Recommend IV morphine drip and terminally weaned off ventilatory support and full palliative care measures   Hospital day # 2  This patient is critically ill and at significant risk of neurological worsening, death and care requires constant monitoring of vital signs, hemodynamics,respiratory and cardiac monitoring,review of multiple databases, neurological assessment, discussion with family, other specialists and medical decision making of high complexity.I  I spent 40 minutes of neurocritical care time in the care of this patient.  Delia Heady, MD [facility]  To contact Stroke Continuity provider, please refer to [email or web address]. After hours, contact General Neurology

## 2017-12-04 NOTE — Consult Note (Signed)
Consultation Note Date: 30-Nov-2017   Patient Name: Zachary Greene  DOB: [birth year]  MRN: [id number]  Age / Sex: 76 y.o., male  PCP: Patient, No Pcp Per Referring Physician: Rush Farmer, MD   Reason for Consultation: Establishing goals of care and Terminal Care  HPI/Patient Profile: 77 y.o. male  with no significant past medical history admitted on 12/03/2017 after [place] in [place]. Down time unknown. In ED, found to be hypoxic and agitated. Patient was intubated. CT head revealed massive acute left MCA stroke and acute pulmonary emboli. Neurology consulted. The core size of stroke was felt too large for neuro interventional procedure. CVA with hemorrhagic conversion. Heparin gtt discontinued. Poor prognosis. Palliative medicine consultation placed.   Clinical Assessment and Goals of Care: I have reviewed medical records, discussed with care team, and met with sister Lovey Newcomer) and niece Jocelyn Lamer) at bedside to discuss diagnosis, prognosis, GOC, and EOL wishes.  Introduced Palliative Medicine as specialized medical care for people living with serious illness.  We discussed a brief life review of the patient. No spouse or children. He has 4 supportive sisters. Prior to stroke, patient completely independent and very active per family. Lovey Newcomer states "he has never been sick."   Menands recalls conversations with critical care and neurology teams. Patient does not have a documented living will or HCPOA. The sisters have discussed goals of care and agreed that Jettie would not want to live "as a vegetable" and with poor quality of life. They have chosen to focus on comfort measures only. Patient was terminally extubated this afternoon.   Discussed comfort focused care with goal of comfort and dignity at EOL. Educated on medications for symptom management and EOL expectations.   Chaplain recently  visited with family. Emotional and spiritual support provided.    SUMMARY OF RECOMMENDATIONS    DNR/DNI  Comfort measures only. Discontinued labs/medications/interventions not aimed at comfort.   Symptom management--see below.   RN may pronounce.  Anticipate hospital death.   Code Status/Advance Care Planning:  DNR  Symptom Management:   Morphine '4mg'$ /hr continuous infusion  RN may bolus morphine via infusion 1-'2mg'$  q61mn prn pain/dyspnea/air hunger  Robinul 0.'2mg'$  IV q4h scheduled for copious secretions  Robinul 0.'2mg'$  IV q4h prn secretions  Ativan '1mg'$  IV q4h prn anxiety/agitation  Palliative Prophylaxis:   Aspiration, Frequent Pain Assessment, Oral Care and Turn Reposition  Additional Recommendations (Limitations, Scope, Preferences):  Full Comfort Care  Psycho-social/Spiritual:   Desire for further Chaplaincy support:yes  Additional Recommendations: Caregiving  Support/Resources and Education on Hospice  Prognosis:   Hours - Days  Discharge Planning: To Be Determined likely hospital death      Primary Diagnoses: Present on Admission: . Stroke ([facility]   I have reviewed the medical record, interviewed the patient and family, and examined the patient. The following aspects are pertinent.  History reviewed. No pertinent past medical history. Social History   Socioeconomic History  . Marital status: Legally Separated    Spouse name: None  . Number of children: None  .  Years of education: None  . Highest education level: None  Social Needs  . Financial resource strain: None  . Food insecurity - worry: None  . Food insecurity - inability: None  . Transportation needs - medical: None  . Transportation needs - non-medical: None  Occupational History  . None  Tobacco Use  . Smoking status: Never Smoker  Substance and Sexual Activity  . Alcohol use: No    Frequency: Never  . Drug use: No  . Sexual activity: None  Other Topics Concern  . None    Social History Narrative  . None   History reviewed. No pertinent family history. Scheduled Meds: . glycopyrrolate  0.2 mg Intravenous Q4H  . sodium chloride flush  3 mL Intravenous Q12H   Continuous Infusions: . dexrose 5 % and 0.45 % NaCl with KCl 30 mEq/L 10 mL/hr at 12-12-17 1437  . morphine 4 mg/hr (12-12-2017 1400)   PRN Meds:.acetaminophen **OR** acetaminophen (TYLENOL) oral liquid 160 mg/5 mL **OR** acetaminophen, atropine, glycopyrrolate, LORazepam, metoprolol tartrate, morphine, ondansetron **OR** ondansetron (ZOFRAN) IV Medications Prior to Admission:  Prior to Admission medications   Not on File   No Known Allergies Review of Systems  Unable to perform ROS: Acuity of condition   Physical Exam  Constitutional: He appears ill.  Cardiovascular: Regular rhythm.  Pulmonary/Chest: No accessory muscle usage. No tachypnea. No respiratory distress. He has rhonchi.  Regular, shallow respirations. Copious secretions-scheduled Robinul  Neurological: He is unresponsive.  Skin: Skin is warm and dry.  Nursing note and vitals reviewed.  Vital Signs: BP 108/72   Pulse (!) 55   Temp (!) 100.6 F (38.1 C)   Resp 16   Ht 6' (1.829 m)   Wt 79.4 kg (175 lb 0.7 oz)   SpO2 100%   BMI 23.74 kg/m  Pain Assessment: CPOT     SpO2: SpO2: 100 % O2 Device:SpO2: 100 % O2 Flow Rate: .O2 Flow Rate (L/min): 15 L/min  IO: Intake/output summary:   Intake/Output Summary (Last 24 hours) at 2017/12/12 1516 Last data filed at 2017-12-12 1400 Gross per 24 hour  Intake 2470.67 ml  Output 1180 ml  Net 1290.67 ml    LBM: Last BM Date: (PTA) Baseline Weight: Weight: 80 kg (176 lb 5.9 oz) Most recent weight: Weight: 79.4 kg (175 lb 0.7 oz)     Palliative Assessment/Data: PPS 10%   Flowsheet Rows     Most Recent Value  Intake Tab  Referral Department  Critical care  Unit at Time of Referral  ICU  Palliative Care Primary Diagnosis  Neurology  Palliative Care Type  New Palliative care   Date first seen by Palliative Care  December 12, 2017  Clinical Assessment  Palliative Performance Scale Score  10%  Psychosocial & Spiritual Assessment  Palliative Care Outcomes  Patient/Family meeting held?  Yes  Who was at the meeting?  sister and niece  Palliative Care Outcomes  Clarified goals of care, Provided end of life care assistance, Provided psychosocial or spiritual support, Changed to focus on comfort, Improved pain interventions, Improved non-pain symptom therapy      Time In: 1440 Time Out: 1525 Time Total: 59mn Greater than 50%  of this time was spent counseling and coordinating care related to the above assessment and plan.  Signed by:  MIhor Dow FNP-C Palliative Medicine Team  Phone: [phone]: [phone]  Please contact Palliative Medicine Team phone at [phone] questions and concerns.  For individual provider: See AShea Evans

## 2017-12-04 NOTE — Progress Notes (Addendum)
Ct head shows large left MCA stroke with hemorrhagic conversion ( PH1) in left BG. Will stop heparin gtt.  Consider palliative consult.

## 2017-12-04 NOTE — Progress Notes (Signed)
Wasted 160 cc morphine with Gloriajean DellGreg D. RN in sink.

## 2017-12-04 NOTE — Progress Notes (Signed)
PT Cancellation Note  Patient Details Name: Zachary Greene MRN: [id number] DOB: 1941-06-29   Cancelled Treatment:    Reason Eval/Treat Not Completed: Medical issues which prohibited therapy   Noted hemorrhagic conversion of L MCA CVA, stopping heparin; continues to be on the vent;   Will check back tomorrow for appropriateness of PT evaluation;   Thank you,  Van ClinesHolly Leeza Heiner, PT  Acute Rehabilitation Services Pager [phone] Office [phone]    Zachary Greene 11/13/2017, 7:26 AM

## 2017-12-04 NOTE — Progress Notes (Signed)
SLP Cancellation Note  Patient Details Name: Zachary Greene MRN: [id number] DOB: Sep 26, 1941   Cancelled treatment:       Reason Eval/Treat Not Completed: Patient not medically ready. Pt remains intubated. SLP will s/o; please re-order when appropriate.  Rondel BatonMary Beth Deontre Greene, TennesseeMS, CCC-SLP Speech-Language Pathologist [phone]   Zachary Greene 15-Oct-2017, 9:20 AM

## 2017-12-04 NOTE — Progress Notes (Signed)
Chaplain responded to request for prayer by family members following decision to change patient care to comfort care. Two sisters present and family friends. [place] remaining sister in [place].  Offered prayer and blessing.  Please call as needed for ongoing support.   Theodoro ParmaKristina N Joette Schmoker, Chaplain  [phone]     October 25, 2017 1100  Clinical Encounter Type  Visited With Patient and family together  Visit Type Initial;Spiritual support  Referral From Nurse  Consult/Referral To Chaplain  Spiritual Encounters  Spiritual Needs Prayer  Stress Factors  Family Stress Factors Family relationships

## 2017-12-04 NOTE — Progress Notes (Signed)
ANTICOAGULATION CONSULT NOTE Pharmacy Consult for Heparin  Indication: pulmonary embolus  No Known Allergies  Patient Measurements: Height: 6' (182.9 cm) Weight: 169 lb 1.5 oz (76.7 kg) IBW/kg (Calculated) : 77.6  Vital Signs: Temp: 99.5 F (37.5 C) (12/23 0400) Temp Source: Core (Comment) (12/23 0400) BP: 91/72 (12/23 0400) Pulse Rate: 51 (12/23 0400)  Labs: Recent Labs    2017/05/23 1135  2017/05/23 1148 2017/05/23 1153 2017/05/23 1648 2017/05/23 2029 2017/05/23 2348 11/24/17 0246 11/24/17 1214 11/24/17 1816 12/03/2017 0317  HGB 15.4  --   --  16.0  --   --   --  13.7  --   --  13.1  HCT 45.5  --   --  47.0  --   --   --  39.7  --   --  39.0  PLT 127*  --   --   --   --   --   --  120*  --   --  127*  APTT  --    < > 28  --  184*  --   --   --  105* 38*  --   LABPROT  --   --  15.2  --  16.6*  --   --   --   --   --   --   INR  --   --  1.21  --  1.35  --   --   --   --   --   --   HEPARINUNFRC  --   --   --   --   --   --  0.59  --  0.53  --  0.18*  CREATININE 1.43*  --   --  1.20  --   --   --  1.19  --   --   --   CKTOTAL  --   --   --   --  255  --   --   --   --   --   --   CKMB  --   --   --   --  9.2*  --   --   --   --   --   --   TROPONINI  --   --   --   --  0.41* 0.29*  --  0.33*  --   --   --    < > = values in this interval not displayed.    Estimated Creatinine Clearance: 58.2 mL/min (by C-G formula based on SCr of 1.19 mg/dL).  Assessment: 77 yo male with acute PE and concurrent acute ischemic CVA for heparin.  Pt had blood from OGT yesterday evening, so heparin was held x 2.5 hours and rate reduced to 1000 units/hr as per MD note.  Goal of Therapy:  Heparin level 0.3-0.5 units/ml Monitor platelets by anticoagulation protocol: Yes   Plan:  Increase Heparin 1200 units/hr Check heparin level in 8 hours.   Geannie RisenGreg Tavoris Brisk, PharmD, BCPS  11/23/2017,4:39 AM

## 2017-12-04 NOTE — Procedures (Signed)
Extubation Procedure Note  Patient Details:   Name: Zachary Greene DOB: 1941-05-03 MRN: [id number]   Airway Documentation:     Evaluation  O2 sats: stable throughout Complications: No apparent complications Patient did tolerate procedure well. Bilateral Breath Sounds: Rhonchi, Diminished   No   Pt extubated per MD order.  Pt is a DNR and no re-intubation.  RN at bedside as well as family.  Pt placed on 4L [place].  RT will continue to monitor.  Ronny FlurryMorgan B Mattox Schorr July 09, 2017, 12:48 PM

## 2017-12-04 DEATH — deceased

## 2018-01-04 NOTE — Discharge Summary (Addendum)
Zachary Bartholomew CrewsS Zachary Greene was admitted on 12/02/17 after being found unresponsive.  He was noted to have right sided paralysis.  He was found to have Lt MCA CVA.  He was seen by neurology.  He has also found to have acute pulmonary embolism.  He required intubation and mechanical ventilatory support.  He was started on heparin infusion.  Echo showed diastolic dysfunction and elevated pulmonary artery pressure.  Follow up CT head showed hemorrhagic conversion of Lt MCA CVA.  Heparin infusion was stopped.  Palliative care was consulted.  Family decided for DNR/DNI status and transition to comfort care.  He was extubated on 11/17/2017.  He expired on 11/10/2017.  Final Diagnoses: Acute let MCA CVA with hemorrhagic conversion Right hemiparesis Acute pulmonary embolism with acute cor pulmonale Acute hypoxic respiratory failure Non anion gap metabolic acidosis Hyperglycemia Chronic diastolic CHF  Coralyn HellingVineet Ludie Hudon, MD [facility] 12/24/2017, 5:22 PM

## 2018-04-10 IMAGING — CT CT HEAD W/O CM
4 series · 16 of 47 positions shown, 18 images · non-contrast
Comparison: Head CT 11/23/2017

CLINICAL DATA: Stroke follow-up

EXAM:
CT HEAD WITHOUT CONTRAST
TECHNIQUE: Contiguous axial images were obtained from the base of the skull
through the vertex without intravenous contrast.

[Series 3: head without · axial · non-contrast · 0.46mm/px · z∈[-140,-5]mm · 7 of 37 slices shown, 9 images]
[im 5/37  brain]
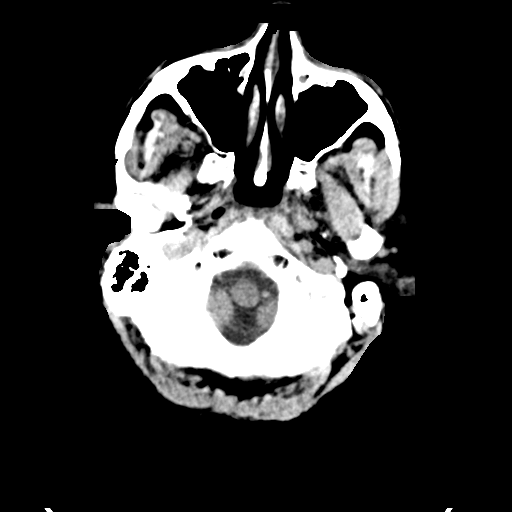
[im 5/37  bone]
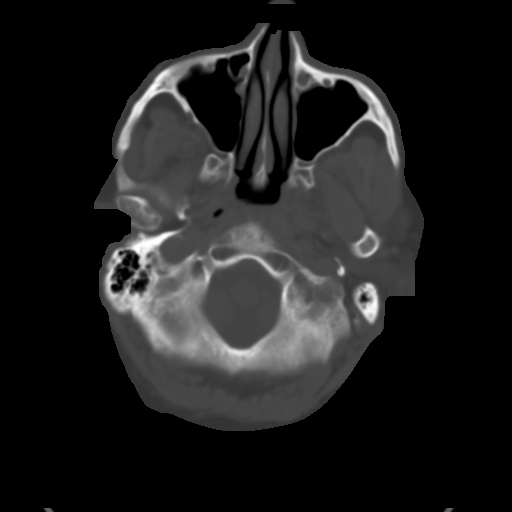
[im 10/37  brain]
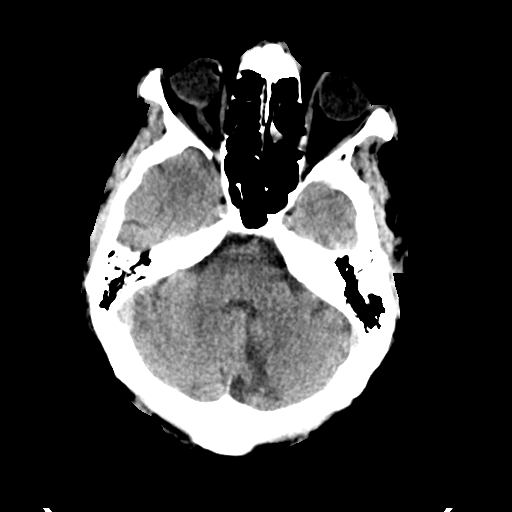
[im 14/37  brain]
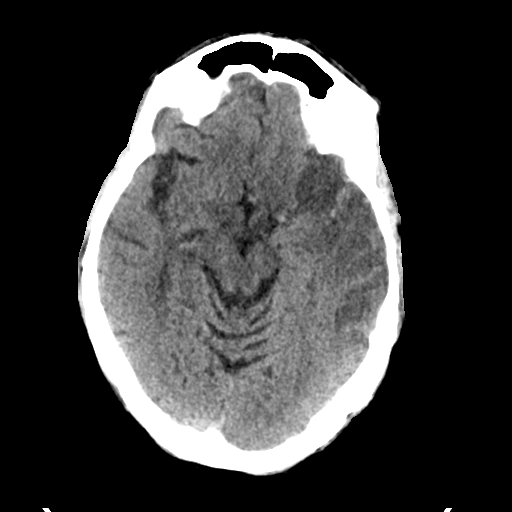
[im 19/37  brain]
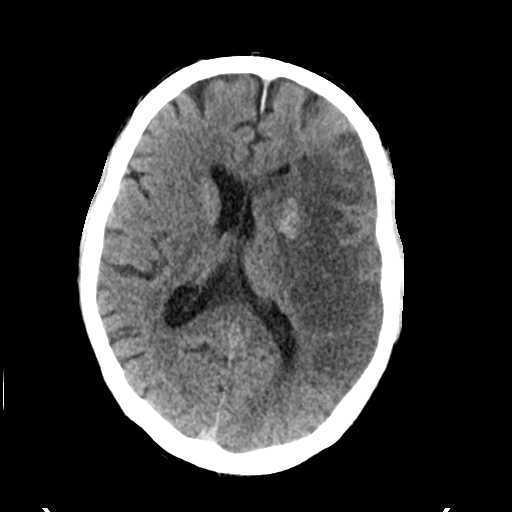
[im 23/37  brain]
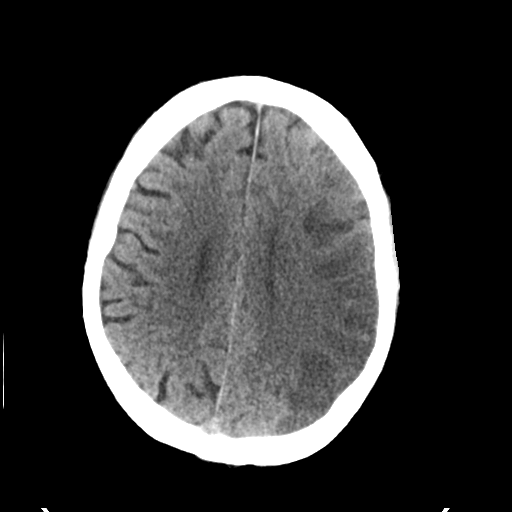
[im 23/37  bone]
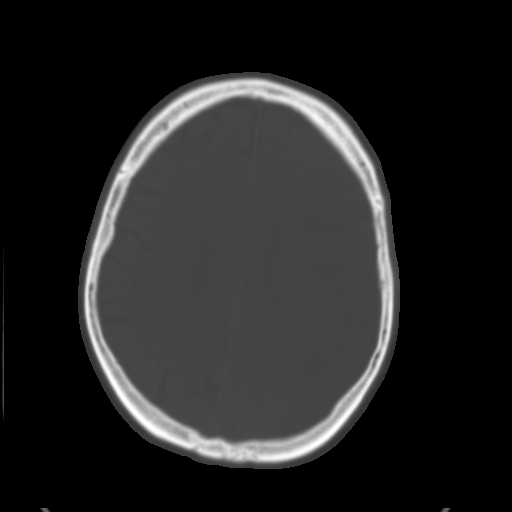
[im 28/37  brain]
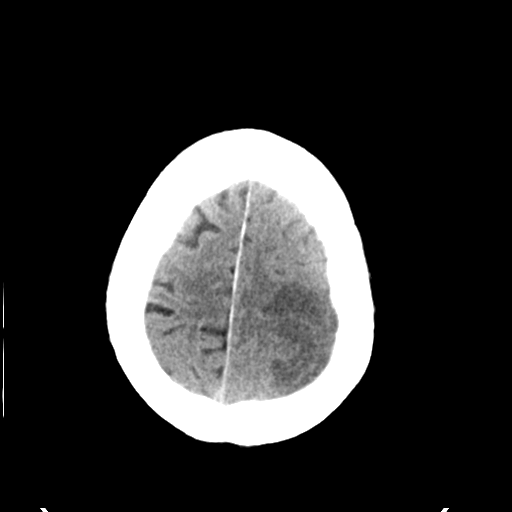
[im 32/37  brain]
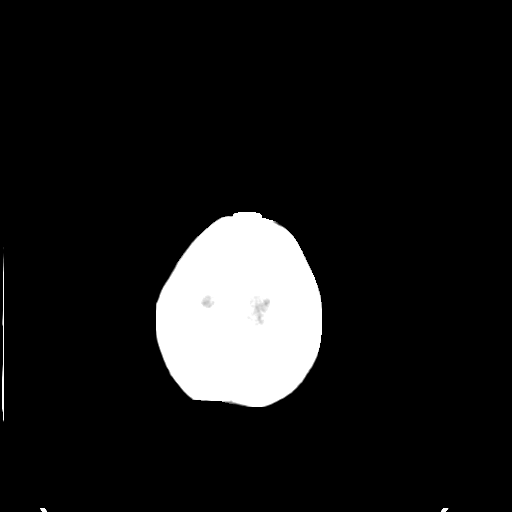

[Series 4: head bone · axial · 0.46mm/px · z∈[-142,-106]mm · 3 of 91 slices shown]
[im 10/91  bone]
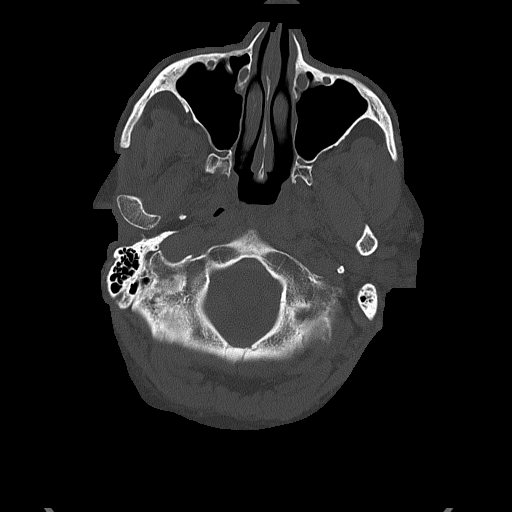
[im 19/91  bone]
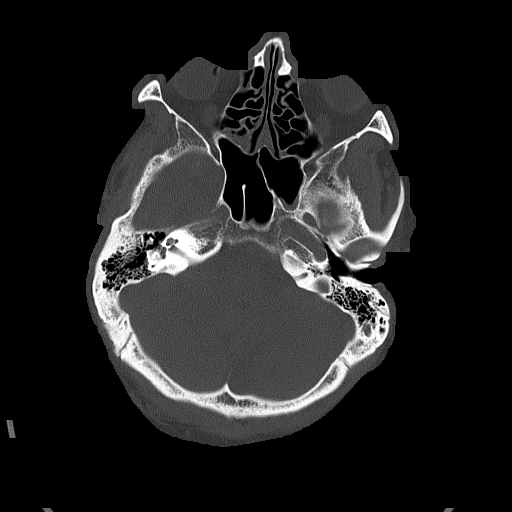
[im 28/91  bone]
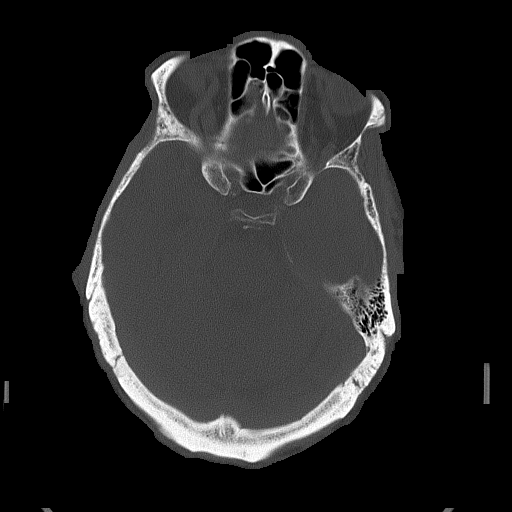

[Series 5: head without cor · coronal · non-contrast · 0.34mm/px · 3 of 81 slices shown]
[im 27/81  brain]
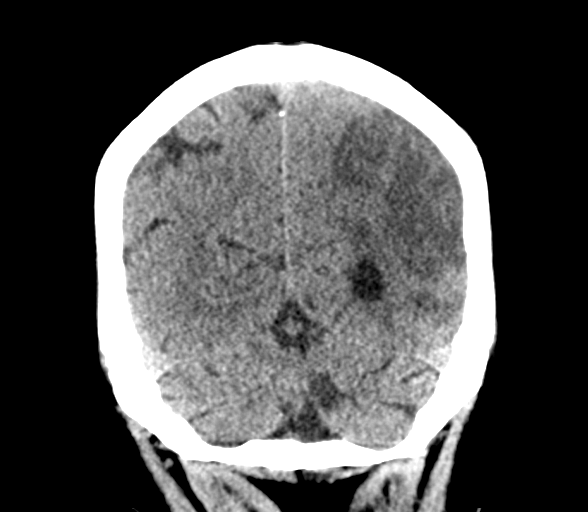
[im 36/81  brain]
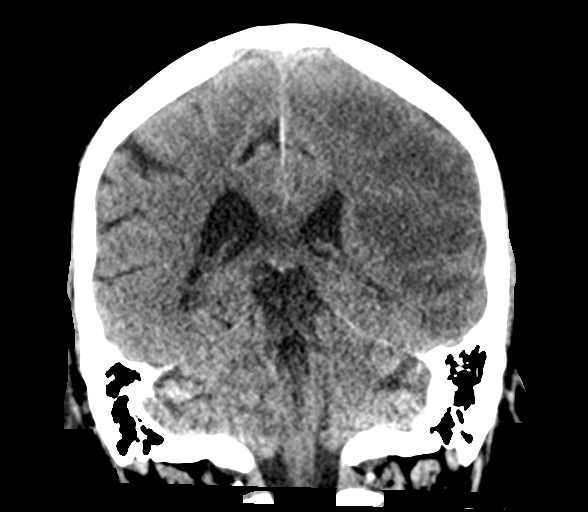
[im 45/81  brain]
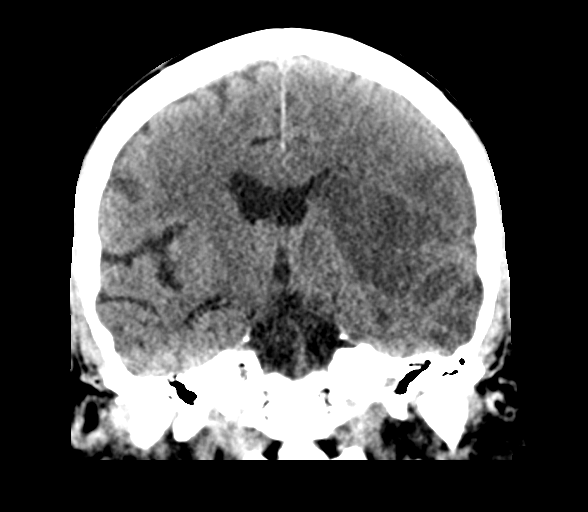

[Series 6: head without sag · sagittal · non-contrast · 0.36mm/px · 3 of 65 slices shown]
[im 22/65  brain]
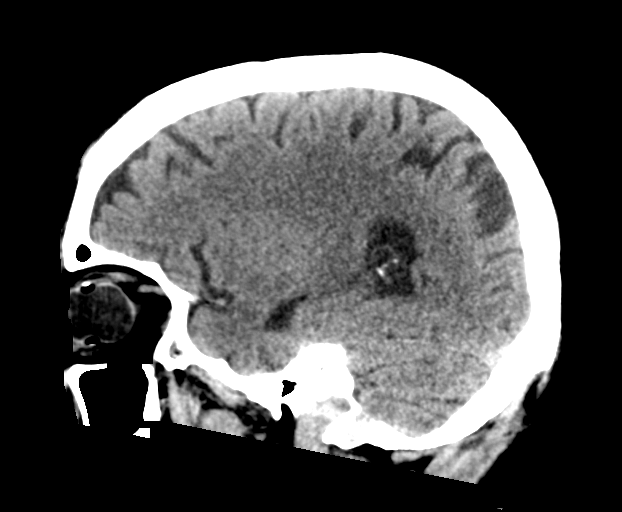
[im 33/65  brain]
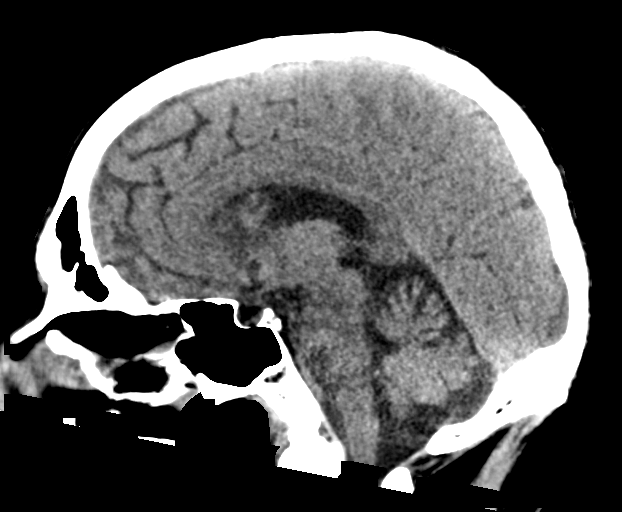
[im 43/65  brain]
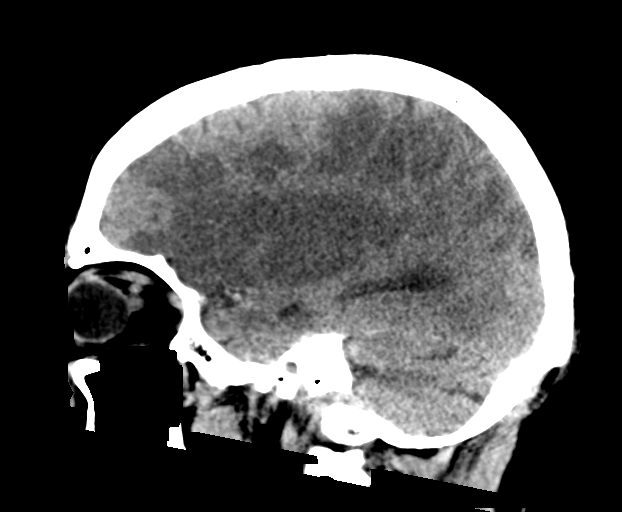

[16 of 47 positions shown; findings below may reference images not displayed]

FINDINGS: Brain: Cytotoxic edema throughout the left MCA distribution has
markedly increased from the prior study. There is greater mass
effect on left lateral ventricle with midline shift to the right
that measures approximately 3 mm. There is a small focus of
intraparenchymal hematoma at the left for for striated measuring
x 1.1 cm. No hydrocephalus. Basal cisterns are patent.

Vascular: No hyperdense vessel or unexpected calcification.

Skull: Normal visualized skull base, calvarium and extracranial soft
tissues.

Sinuses/Orbits: No sinus fluid levels or advanced mucosal
thickening. No mastoid effusion. Normal orbits.
IMPRESSION: 1. Small new intraparenchymal hematoma within the left basal ganglia
measuring 2.2 x 1.1 cm. Increased cytotoxic edema throughout the
left MCA territory with newly developed 3 mm of rightward midline
shift.
2. No hydrocephalus or herniation.
These results were called by telephone at the time of interpretation
on 11/25/2017 at [DATE] to Maylen Bonn, who verbally
acknowledged these results.
